# Patient Record
Sex: Male | Born: 1950 | Race: Black or African American | Hispanic: No | Marital: Single | Smoking: Never smoker
Health system: Southern US, Community
[De-identification: ages and names within clinical notes are randomized; demographics above are authoritative.]

## PROBLEM LIST (undated history)

## (undated) DIAGNOSIS — G709 Myoneural disorder, unspecified: Secondary | ICD-10-CM

## (undated) DIAGNOSIS — M1990 Unspecified osteoarthritis, unspecified site: Secondary | ICD-10-CM

## (undated) DIAGNOSIS — I10 Essential (primary) hypertension: Secondary | ICD-10-CM

## (undated) DIAGNOSIS — E119 Type 2 diabetes mellitus without complications: Secondary | ICD-10-CM

## (undated) HISTORY — PX: WISDOM TOOTH EXTRACTION: SHX21

---

## 1998-12-23 ENCOUNTER — Ambulatory Visit (HOSPITAL_COMMUNITY): Admission: RE | Admit: 1998-12-23 | Discharge: 1998-12-23 | Payer: Self-pay | Admitting: Dermatology

## 1999-01-04 ENCOUNTER — Ambulatory Visit (HOSPITAL_COMMUNITY): Admission: RE | Admit: 1999-01-04 | Discharge: 1999-01-04 | Payer: Self-pay | Admitting: Dermatology

## 2007-09-21 ENCOUNTER — Ambulatory Visit (HOSPITAL_COMMUNITY): Admission: RE | Admit: 2007-09-21 | Discharge: 2007-09-21 | Payer: Self-pay | Admitting: Neurological Surgery

## 2008-12-13 IMAGING — CR DG CHEST 2V
2 series · 2 of 2 positions shown · non-contrast
Comparison: None.

CLINICAL DATA: Pre-op chest x-ray.  Smoker.  
 CHEST - 2 VIEW:

[view not recorded (1 of 2)]
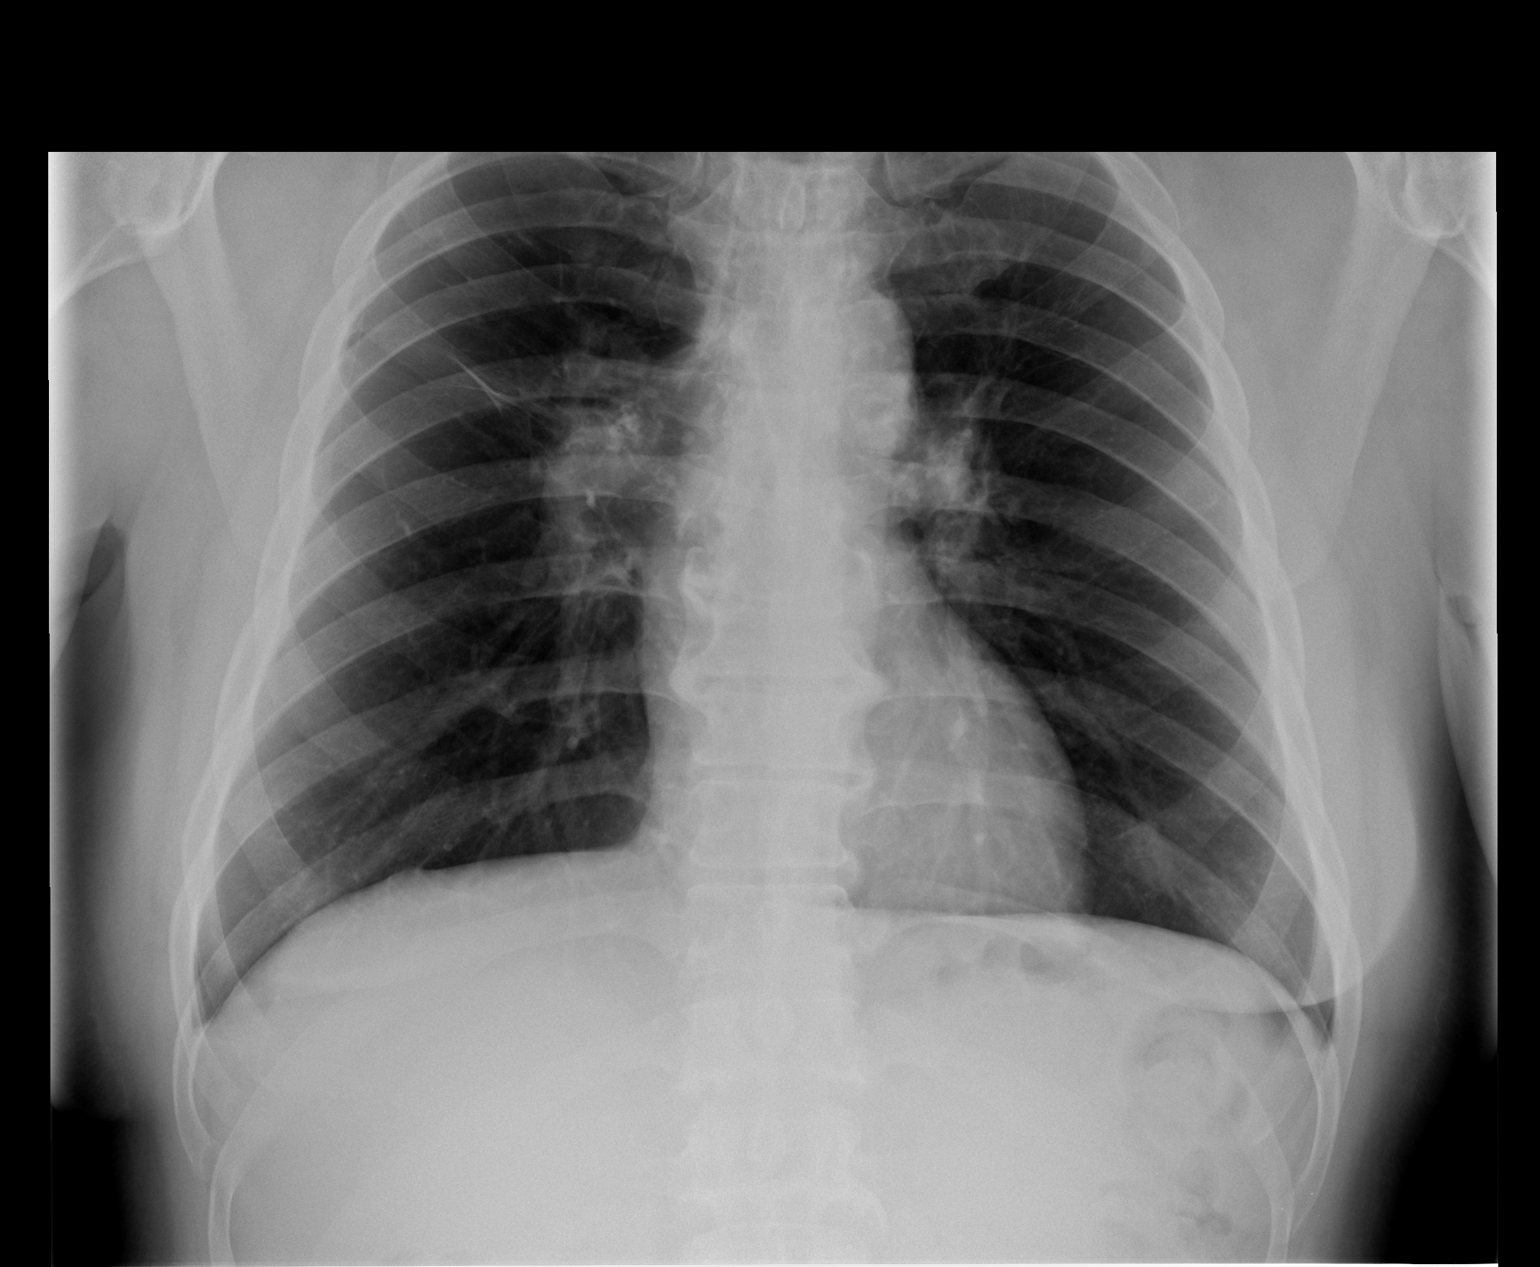

[view not recorded (2 of 2)]
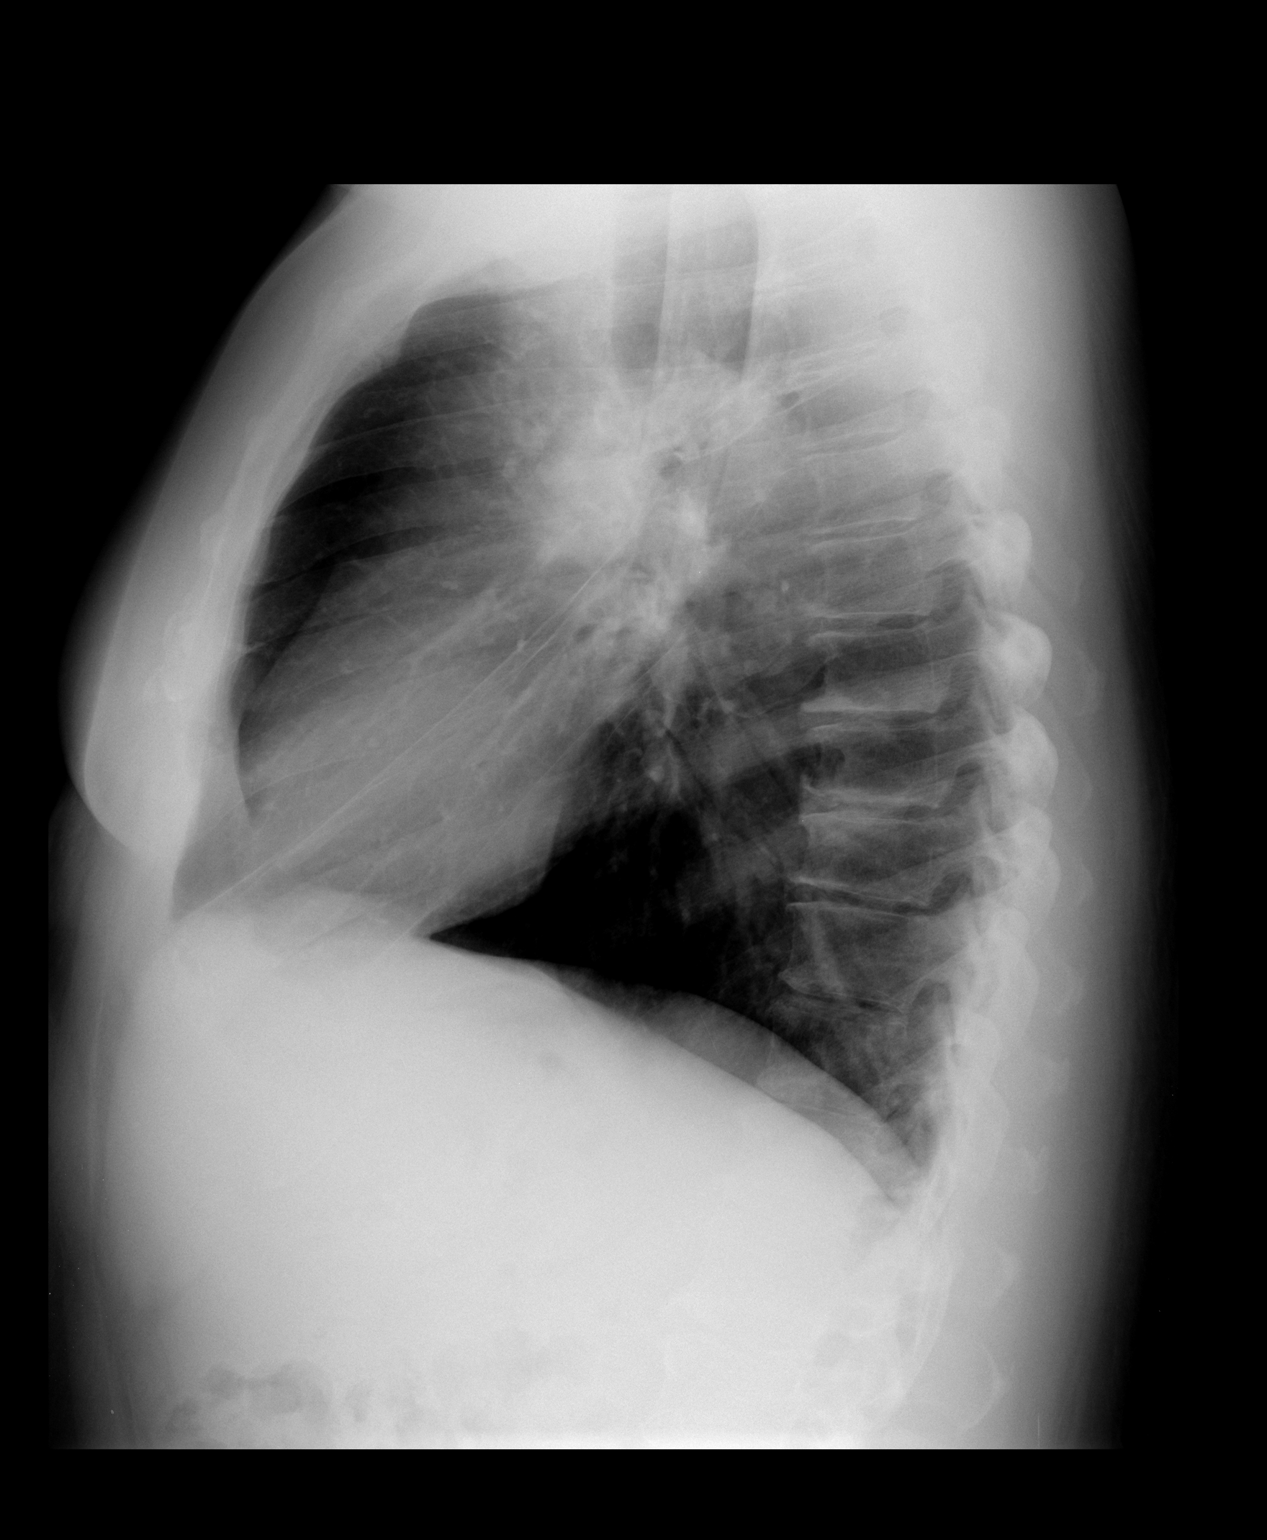

[2 of 2 positions shown; findings below may reference images not displayed]

FINDINGS: Trachea is midline.   Heart size normal.  Question mild right hilar fullness. Linear opacities in the right mid-lung zone may be associated with bullous disease.  Left lung clear.  No pleural fluid.
IMPRESSION: Question right hilar fullness.  This could be further evaluated with followup chest x-ray or chest CT in this patient with a smoking history, as clinically indicated.

## 2011-03-30 NOTE — Op Note (Signed)
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   PROCEDURE:  Left peroneal nerve decompression.   SURGEON:  Tia Alert, M.D.   ANESTHESIA:  General endotracheal anesthesia.   COMPLICATIONS:  None apparent.   INDICATIONS FOR PROCEDURE:  Mr. Tramell is a 60 year old gentleman who  presented with a 2-3 month history of a foot drop on the left side.  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  I recommended a peroneal nerve decompression with  removal of part of the fibular head in hopes of improving his foot drop.  He understood the risks, benefits, and expected outcome and wished to  proceed.   DESCRIPTION OF PROCEDURE:  The patient was taken to the operating room.  After induction of adequate generalized endotracheal anesthesia, he was  rolled into the prone position on the Wilson frame and all pressure  points were padded.  His posterior and inferior leg was prepped with  DuraPrep and draped in the usual sterile fashion.  10 mL of local  anesthesia was injected and a curvilinear incision was made from the  lateral hamstring musculature down over the lateral head of the fibula  and distally along the dorsiflexors. I was able to place self-retaining  retractors, dissect through the soft tissues to identify the peroneal  nerve just proximal to the fibula. I then decompressed this proximally  along the hamstring musculature.  I then decompressed this distally past  the fibular head  noting where the common peroneal nerve branch divided  into the superficial and deep peroneal nerves. I followed these nerves  distally and had those well decompressed, also, finding their insertions  into the peroneus longus musculature.  Once I had the nerve  decompressed, I used a periosteal elevator to remove the soft tissue  from the fibular head and then resected the lateral part of the fibular  head with Leksell rongeurs and then waxed this and then closed the  tissue over the fibular head.  The nerve was then free.  I felt quite  good about the decompression of the nerve.  I irrigated with saline  solution containing bacitracin and then closed the subcutaneous tissues  with 2-0 Vicryl, closed the subcuticular tissues with 3-0 Vicryl, and  the skin was closed with Benzoin and Steri-Strips.  The  leg was then wrapped in Kerlix and Ace bandage and the patient was  awakened from anesthesia and transferred to the recovery room in stable  condition.  At the end of the procedure, all sponge, needle and  instrument counts were correct.  Tia Alert, MD  Electronically Signed     DSJ/MEDQ  D:  09/21/2007  T:  09/22/2007  Job:  [id number]

## 2011-08-24 LAB — BASIC METABOLIC PANEL
Calcium: 9.1
Chloride: 102
Creatinine, Ser: 0.82
GFR calc Af Amer: >60
GFR calc non Af Amer: >60
Glucose, Bld: 185 — ABNORMAL HIGH

## 2011-08-24 LAB — DIFFERENTIAL
Basophils Relative: 0
Eosinophils Relative: 2
Lymphocytes Relative: 33
Neutro Abs: 2.9
Neutrophils Relative %: 54

## 2011-08-24 LAB — CBC
Hemoglobin: 13.6
Platelets: 295
RBC: 4.43
RDW: 13

## 2011-08-24 LAB — PROTIME-INR: Prothrombin Time: 13

## 2017-11-15 HISTORY — PX: COLONOSCOPY: SHX174

## 2019-11-16 DIAGNOSIS — I639 Cerebral infarction, unspecified: Secondary | ICD-10-CM

## 2019-11-16 HISTORY — DX: Cerebral infarction, unspecified: I63.9

## 2021-08-25 ENCOUNTER — Emergency Department (HOSPITAL_COMMUNITY)
Admission: EM | Admit: 2021-08-25 | Discharge: 2021-08-25 | Disposition: A | Discharge status: Home / Self Care | Payer: Medicare Other | Attending: Emergency Medicine | Admitting: Emergency Medicine

## 2021-08-25 ENCOUNTER — Other Ambulatory Visit: Payer: Self-pay

## 2021-08-25 ENCOUNTER — Emergency Department (HOSPITAL_COMMUNITY): Payer: Medicare Other

## 2021-08-25 DIAGNOSIS — R1011 Right upper quadrant pain: Secondary | ICD-10-CM | POA: Insufficient documentation

## 2021-08-25 DIAGNOSIS — R1013 Epigastric pain: Secondary | ICD-10-CM | POA: Insufficient documentation

## 2021-08-25 DIAGNOSIS — R1012 Left upper quadrant pain: Secondary | ICD-10-CM | POA: Insufficient documentation

## 2021-08-25 DIAGNOSIS — R109 Unspecified abdominal pain: Secondary | ICD-10-CM

## 2021-08-25 DIAGNOSIS — R6883 Chills (without fever): Secondary | ICD-10-CM | POA: Insufficient documentation

## 2021-08-25 DIAGNOSIS — R112 Nausea with vomiting, unspecified: Secondary | ICD-10-CM | POA: Insufficient documentation

## 2021-08-25 LAB — CBC WITH DIFFERENTIAL/PLATELET
Abs Immature Granulocytes: 0.01 10*3/uL (ref 0.00–0.07)
Basophils Absolute: 0 10*3/uL (ref 0.0–0.1)
Basophils Relative: 1 %
Eosinophils Absolute: 0 10*3/uL (ref 0.0–0.5)
Eosinophils Relative: 1 %
HCT: 36.8 % — ABNORMAL LOW (ref 39.0–52.0)
Hemoglobin: 12.2 g/dL — ABNORMAL LOW (ref 13.0–17.0)
Immature Granulocytes: 0 %
Lymphocytes Relative: 35 %
Lymphs Abs: 1.3 10*3/uL (ref 0.7–4.0)
MCH: 31 pg (ref 26.0–34.0)
MCHC: 33.2 g/dL (ref 30.0–36.0)
MCV: 93.6 fL (ref 80.0–100.0)
Monocytes Absolute: 0.4 10*3/uL (ref 0.1–1.0)
Monocytes Relative: 9 %
Neutro Abs: 2.1 10*3/uL (ref 1.7–7.7)
Neutrophils Relative %: 54 %
Platelets: 314 10*3/uL (ref 150–400)
RBC: 3.93 MIL/uL — ABNORMAL LOW (ref 4.22–5.81)
RDW: 13.1 % (ref 11.5–15.5)
WBC: 3.8 10*3/uL — ABNORMAL LOW (ref 4.0–10.5)
nRBC: 0 % (ref 0.0–0.2)

## 2021-08-25 LAB — COMPREHENSIVE METABOLIC PANEL
ALT: 20 U/L (ref 0–44)
AST: 32 U/L (ref 15–41)
Albumin: 3.9 g/dL (ref 3.5–5.0)
Alkaline Phosphatase: 77 U/L (ref 38–126)
Anion gap: 12 (ref 5–15)
BUN: 30 mg/dL — ABNORMAL HIGH (ref 8–23)
CO2: 24 mmol/L (ref 22–32)
Calcium: 9.7 mg/dL (ref 8.9–10.3)
Chloride: 98 mmol/L (ref 98–111)
Creatinine, Ser: 1.21 mg/dL (ref 0.61–1.24)
GFR, Estimated: >60 mL/min (ref >60)
Glucose, Bld: 263 mg/dL — ABNORMAL HIGH (ref 70–99)
Potassium: 4.4 mmol/L (ref 3.5–5.1)
Sodium: 134 mmol/L — ABNORMAL LOW (ref 135–145)
Total Bilirubin: 0.9 mg/dL (ref 0.3–1.2)
Total Protein: 7.6 g/dL (ref 6.5–8.1)

## 2021-08-25 LAB — LIPASE, BLOOD: Lipase: 104 U/L — ABNORMAL HIGH (ref 11–51)

## 2021-08-25 MED ORDER — ONDANSETRON 4 MG PO TBDP
4.0000 mg | ORAL_TABLET | Freq: Once | ORAL | Status: DC
  Filled 2021-08-25: qty 1

## 2021-08-25 MED ORDER — SODIUM CHLORIDE 0.9 % IV BOLUS
1000.0000 mL | Freq: Once | INTRAVENOUS | Status: AC
Start: 2021-08-25 — End: 2021-08-25
  Administered 2021-08-25: 1000 mL via INTRAVENOUS

## 2021-08-25 MED ORDER — IOHEXOL 350 MG/ML SOLN
80.0000 mL | Freq: Once | INTRAVENOUS | Status: AC | PRN
  Administered 2021-08-25: 80 mL via INTRAVENOUS

## 2021-08-25 MED ORDER — MORPHINE SULFATE (PF) 2 MG/ML IV SOLN
2.0000 mg | Freq: Once | INTRAVENOUS | Status: AC
  Administered 2021-08-25: 2 mg via INTRAVENOUS
  Filled 2021-08-25: qty 1

## 2021-08-25 MED ORDER — ONDANSETRON 4 MG PO TBDP: 4.0000 mg | ORAL_TABLET | Freq: Three times a day (TID) | ORAL | 0 refills | Status: DC | PRN

## 2021-08-25 NOTE — ED Provider Notes (Signed)
[facility] EMERGENCY DEPARTMENT Provider Note   CSN: [id number] Arrival date & time: 08/25/21  1041     History Chief Complaint  Patient presents with   Abdominal Pain    Brent Pham. is a 70 y.o. male.  With past medical history of cholelithiasis who presents emergency department with abdominal pain.  Abdominal pain is right upper quadrant, epigastric, left upper quadrant, intermittent, nonradiating dull pain with episodes of shortness.  He has associated nausea and vomiting and chills.  Pain is worse with eating.  Better with drinking milk or fasting.  States that he has taken some Tylenol intermittently with relief.    States that he was seen about a month ago for abdominal pain and was told that he needed gallbladder surgery.  States that the time he wanted to put it off to finish up some housework.  States that the pain is back.    I have personally reviewed his chart for further clarification of when he was seen a month ago for abdominal pain.  It appears that he was admitted on 07/25/2021 for acute biliary pancreatitis.  At that time he presented to the emergency department with pain similar to current presentation.  His lipase was 593.  He had a CT abdomen pelvis with gallbladder sludge.  He had an abdominal ultrasound which demonstrated cholelithiasis without cholecystitis.  He had MRCP at that time which showed mild gallbladder distention and soft tissue stranding around the pancreas with concern of pancreatitis.  He was eventually discharged home after IV fluids, antiemetics and pain regimen.  He was instructed to follow-up with GI for gallbladder removal.  I do not see any further visits where he saw the GI doctor.    Abdominal Pain Associated symptoms: chills, nausea and vomiting   Associated symptoms: no diarrhea and no fever   Abdominal Pain Associated symptoms: nausea and vomiting   Associated symptoms: no diarrhea and no fever   Abdominal  Pain Associated symptoms: nausea and vomiting   Associated symptoms: no diarrhea   Abdominal Pain Associated symptoms: nausea and vomiting   Abdominal Pain Associated symptoms: nausea     No past medical history on file.  There are no problems to display for this patient.   No family history on file.  Home Medications Prior to Admission medications   Not on File   Allergies    Patient has no allergy information on record.  Review of Systems   Review of Systems  Constitutional:  Positive for appetite change and chills. Negative for diaphoresis and fever.  Gastrointestinal:  Positive for abdominal pain, nausea and vomiting. Negative for diarrhea.  All other systems reviewed and are negative.  Physical Exam Updated Vital Signs BP (!) 141/82   Pulse 71   Temp 98.6 F (37 C) (Oral)   Resp 10   SpO2 100%   Physical Exam Vitals and nursing note reviewed.  Constitutional:      General: He is not in acute distress.    Appearance: Normal appearance. He is well-developed. He is not toxic-appearing.  HENT:     Head: Normocephalic and atraumatic.     Mouth/Throat:     Mouth: Mucous membranes are moist.     Pharynx: Oropharynx is clear.  Eyes:     General: No scleral icterus. Cardiovascular:     Rate and Rhythm: Normal rate and regular rhythm.     Heart sounds: Normal heart sounds. No murmur heard. Pulmonary:  Effort: Pulmonary effort is normal. No respiratory distress.     Breath sounds: Normal breath sounds.  Abdominal:     General: Abdomen is flat. Bowel sounds are normal. There is no distension.     Palpations: Abdomen is soft. There is no hepatomegaly.     Tenderness: There is abdominal tenderness in the right upper quadrant, epigastric area and left upper quadrant. There is no guarding or rebound. Positive signs include Murphy's sign. Negative signs include Rovsing's sign and McBurney's sign.  Skin:    General: Skin is warm and dry.     Capillary Refill:  Capillary refill takes less than 2 seconds.     Coloration: Skin is not jaundiced.  Neurological:     General: No focal deficit present.     Mental Status: He is alert and oriented to person, place, and time.  Psychiatric:        Mood and Affect: Mood normal.        Behavior: Behavior normal.    ED Results / Procedures / Treatments   Labs (all labs ordered are listed, but only abnormal results are displayed) Labs Reviewed  COMPREHENSIVE METABOLIC PANEL - Abnormal; Notable for the following components:      Result Value   Sodium 134 (*)    Glucose, Bld 263 (*)    BUN 30 (*)    All other components within normal limits  LIPASE, BLOOD - Abnormal; Notable for the following components:   Lipase 104 (*)    All other components within normal limits  CBC WITH DIFFERENTIAL/PLATELET - Abnormal; Notable for the following components:   WBC 3.8 (*)    RBC 3.93 (*)    Hemoglobin 12.2 (*)    HCT 36.8 (*)    All other components within normal limits    EKG None  Radiology CT Abdomen Pelvis W Contrast  Result Date: 08/25/2021 CLINICAL DATA:  Abdominal pain and vomiting. EXAM: CT ABDOMEN AND PELVIS WITH CONTRAST TECHNIQUE: Multidetector CT imaging of the abdomen and pelvis was performed using the standard protocol following bolus administration of intravenous contrast. CONTRAST:  46mL OMNIPAQUE IOHEXOL 350 MG/ML SOLN COMPARISON:  July 25, 2021 FINDINGS: Lower chest: No acute abnormality. Hepatobiliary: There is diffuse fatty infiltration of the liver parenchyma. No focal liver abnormality is seen. Numerous stable subcentimeter calcifications are seen along the porta hepatis, adjacent to the distal esophagus and anterior to the gastroesophageal junction. The gallbladder is markedly distended. No gallstones, gallbladder wall thickening, or biliary dilatation. Pancreas: Unremarkable. No pancreatic ductal dilatation or surrounding inflammatory changes. Spleen: Normal in size without focal  abnormality. Adrenals/Urinary Tract: Adrenal glands are unremarkable. Kidneys are normal, without renal calculi, focal lesion, or hydronephrosis. Mild, stable posterior urinary bladder wall thickening is seen. This is seen as far back as May 16, 2017. The anterior urinary bladder wall thickening seen on the prior study is no longer visualized. Stomach/Bowel: Stomach is within normal limits. The appendix is not clearly identified. No evidence of bowel dilatation. Marked severity asymmetric colonic wall thickening is seen within the region of the mid ascending colon (axial CT images 42 through 49, CT series 3/coronal reformatted images 39 through 51, CT series 6). This is not clearly identified on the prior study. Vascular/Lymphatic: Aortic atherosclerosis. No enlarged abdominal or pelvic lymph nodes. Reproductive: Prostate is unremarkable. Other: A stable 3.8 cm x 2.0 cm fat containing left inguinal hernia is seen. No abdominopelvic ascites. Musculoskeletal: Marked severity degenerative changes are seen at the level of L4-L5. IMPRESSION:  1. Colonic wall thickening within the region of the mid ascending colon, which is not seen on the prior study and may represent sequelae associated with moderate to marked severity colitis. 2. Fatty liver. 3. Markedly distended gallbladder without evidence of cholelithiasis or acute cholecystitis. Aortic Atherosclerosis (ICD10-I70.0). Electronically Signed   By: Aram Candela M.D.   On: 08/25/2021 19:00   US Abdomen Limited RUQ (LIVER/GB)  Result Date: 08/25/2021 CLINICAL DATA:  Right upper quadrant pain EXAM: ULTRASOUND ABDOMEN LIMITED RIGHT UPPER QUADRANT COMPARISON:  CT and ultrasound 07/25/2021 FINDINGS: Gallbladder: 5 mm nonshadowing focus along the gallbladder wall compatible with small polyp or nonshadowing stone. No visible stones or wall thickening. Small amount of layering sludge. Common bile duct: Diameter: Normal caliber, 2 mm Liver: No focal lesion identified.  Within normal limits in parenchymal echogenicity. Portal vein is patent on color Doppler imaging with normal direction of blood flow towards the liver. Other: None. IMPRESSION: No acute findings. Small gallbladder wall polyp or nonshadowing stone. No evidence of acute cholecystitis. Electronically Signed   By: Charlett Nose M.D.   On: 08/25/2021 12:09    Procedures Procedures   Medications Ordered in ED Medications  ondansetron (ZOFRAN-ODT) disintegrating tablet 4 mg (has no administration in time range)  morphine 2 MG/ML injection 2 mg (2 mg Intravenous Given 08/25/21 1655)  sodium chloride 0.9 % bolus 1,000 mL (1,000 mLs Intravenous New Bag/Given 08/25/21 1655)   ED Course  I have reviewed the triage vital signs and the nursing notes.  Pertinent labs & imaging results that were available during my care of the patient were reviewed by me and considered in my medical decision making (see chart for details).  MDM Rules/Calculators/A&P 70 year old male who presents emergency department for right upper quadrant abdominal pain.   Presentation is most consistent with cholelithiasis.  However will attempt to rule out other etiologies of abdominal pain.  His presentation is not consistent with diverticulitis, appendicitis, dissection.  He has no complaints of cough, sputum production, fever so doubt pneumonia. Labs unremarkable Obtain right upper quadrant ultrasound which showed a nonshadowing stone without evidence of acute cholecystitis. Obtain CT abdomen pelvis with contrast which showed colonic wall thickening of the mid ascending colon, markedly distended gallbladder without evidence of cholelithiasis or cholecystitis. He does not have jaundice, leukocytosis, fever, altered mental status, gallstones concerning for cholangitis. Given 1 L IV fluids, Zofran and morphine.  P.o. challenged here in the emergency department.  Able to tolerate food and water. Given that he does not have increased LFTs,  leukocytosis or fever, we will discharge him with close follow-up with [facility] for removal of his gallbladder as this will be the definitive treatment of his pain.  Final Clinical Impression(s) / ED Diagnoses Final diagnoses:  Abdominal pain    Rx / DC Orders ED Discharge Orders     None        Cristopher Peru, PA-C 08/25/21 1915    Maia Plan, MD 08/25/21 2214

## 2021-08-25 NOTE — Discharge Instructions (Addendum)
You were seen in the emergency department today for abdominal pain.  This appears consistent to the last time you were seen for abdominal pain and admitted for pancreatitis.  However this time your lipase is not elevated and we do not have concern that you have pancreatitis at this time.  You do have perhaps a small gallstone and distention of your gallbladder without infection.  Your labs were all reassuring.  It is imperative that you follow-up with a general surgeon in order to remove your gallbladder which will ultimately relieve the pain that you are having.  I have put in your discharge instructions the name and number of [facility].  Please call them in the morning to set up an appointment and schedule the surgery.  You may return to emergency department for any reason.  Please return for increasing abdominal pain, nausea and vomiting that does not go away and is persistent, fever.

## 2021-08-25 NOTE — ED Notes (Signed)
Pt teaching provided on medications that may cause drowsiness. Pt instructed not to drive or operate heavy machinery while taking the prescribed medication. Pt verbalized understanding.   Pt states he is calling his family to pick him up.   Pt provided discharge instructions and prescription information. Pt was given the opportunity to ask questions and questions were answered. Discharge signature not obtained in the setting of the COVID-19 pandemic in order to reduce high touch surfaces.

## 2021-08-25 NOTE — ED Provider Notes (Signed)
Emergency Medicine Provider Triage Evaluation Note  Brent Pham , a 70 y.o. male  was evaluated in triage.  Pt complains of abdominal pain, nausea and vomiting.  States that he was seen about a month ago for abdominal pain and was told that he needed gallbladder surgery.  States that the time he wanted to put it off to finish up some housework.  States that the pain is back.  He describes it as right upper quadrant, epigastric, left upper quadrant abdominal tenderness.  He has had some increased nausea and vomiting when he eats.  Also endorses chills.  States that at times milk makes it better.  He has been using Tylenol intermittently with some relief.  Review of Systems  Positive: Abdominal pain, nausea, vomiting Negative: Fever, skin or eye yellowing, diarrhea    Physical Exam  BP (!) 150/84 (BP Location: Right Arm)   Pulse 94   Temp 98.6 F (37 C) (Oral)   Resp 16   SpO2 100%  Gen:   Awake, no distress   Resp:  Normal effort  MSK:   Moves extremities without difficulty  Other:  Abdomen is soft. + Bowel sounds.  Tenderness to light palpation of the right upper quadrant, epigastric, left upper quadrant.  Positive Murphy sign.  Negative rebound.  Medical Decision Making  Medically screening exam initiated at 11:08 AM.  Appropriate orders placed.  Dennie Maizes Laureano was informed that the remainder of the evaluation will be completed by another provider, this initial triage assessment does not replace that evaluation, and the importance of remaining in the ED until their evaluation is complete.     Cristopher Peru, PA-C 08/25/21 1110    Terald Sleeper, MD 08/25/21 [phone]

## 2021-08-25 NOTE — ED Notes (Signed)
Pt tolerated water and crackers with no episodes of nausea.

## 2021-08-25 NOTE — ED Triage Notes (Signed)
Pt c/o abdominal pain and vomiting x 1. Pt also reports chills. Per pt was seen at another facility about a month ago for pain, was told pt would need gall bladder surgery.

## 2021-08-31 NOTE — Progress Notes (Signed)
Sent message, via epic in basket, requesting orders in epic from surgeon.  

## 2021-09-03 ENCOUNTER — Ambulatory Visit: Payer: Self-pay | Admitting: General Surgery

## 2021-09-03 DIAGNOSIS — Z01818 Encounter for other preprocedural examination: Secondary | ICD-10-CM

## 2021-09-03 NOTE — Patient Instructions (Addendum)
DUE TO COVID-19 ONLY ONE VISITOR IS ALLOWED TO COME WITH YOU AND STAY IN THE WAITING ROOM ONLY DURING PRE OP AND PROCEDURE DAY OF SURGERY IF YOU ARE GOING HOME AFTER SURGERY. IF YOU ARE SPENDING THE NIGHT 2 PEOPLE MAY VISIT WITH YOU IN YOUR PRIVATE ROOM AFTER SURGERY UNTIL VISITING  HOURS ARE OVER AT 800 PM AND THE 2 VISITORS CANNOT SPEND THE NIGHT.  Brent Pham               Brent Pham.     Your procedure is scheduled on: 09/17/21   Report to [facility] Main  Entrance   Report to admitting at  9:15 AM      Call this number if you have problems the morning of surgery [phone]    Remember: Do not eat food  :After Midnight the night before your surgery,     You may have clear liquids from midnight until ---.8:30  Finish the G2 by then(8:30)  CLEAR LIQUID DIET   Foods Allowed                                                                     Foods Excluded  Coffee and tea, regular and decaf     No milk or creamer                                                                 liquids that you cannot  Plain Jell-O any favor except red or purple                                           see through such as: Fruit ices (not with fruit pulp)                                     milk, soups, orange juice  Iced Popsicles                                    All solid food Carbonated beverages, regular and diet                                    Cranberry, grape and apple juices Sports drinks like Gatorade Lightly seasoned clear broth or consume(fat free) Sugar    BRUSH YOUR TEETH MORNING OF SURGERY AND RINSE YOUR MOUTH OUT, NO CHEWING GUM CANDY OR MINTS.        BRUSH YOUR TEETH MORNING OF SURGERY AND RINSE YOUR MOUTH OUT, NO CHEWING GUM CANDY OR MINTS.     Take these medicines the morning of surgery with A SIP OF WATER: none  DO NOT TAKE ANY DIABETIC MEDICATIONS DAY OF  YOUR SURGERY How to Manage Your Diabetes Before and After Surgery  Why is it important to  control my blood sugar before and after surgery? Improving blood sugar levels before and after surgery helps healing and can limit problems. A way of improving blood sugar control is eating a healthy diet by:  Eating less sugar and carbohydrates  Increasing activity/exercise  Talking with your doctor about reaching your blood sugar goals High blood sugars (greater than 180 mg/dL) can raise your risk of infections and slow your recovery, so you will need to focus on controlling your diabetes during the weeks before surgery. Make sure that the doctor who takes care of your diabetes knows about your planned surgery including the date and location.  How do I manage my blood sugar before surgery? Check your blood sugar at least 4 times a day, starting 2 days before surgery, to make sure that the level is not too high or low. Check your blood sugar the morning of your surgery when you wake up and every 2 hours until you get to the Short Stay unit. If your blood sugar is less than 70 mg/dL, you will need to treat for low blood sugar: Do not take insulin. Treat a low blood sugar (less than 70 mg/dL) with  cup of clear juice (cranberry or apple), 4 glucose tablets, OR glucose gel. Recheck blood sugar in 15 minutes after treatment (to make sure it is greater than 70 mg/dL). If your blood sugar is not greater than 70 mg/dL on recheck, call [phone] for further instructions. Report your blood sugar to the short stay nurse when you get to Short Stay.  If you are admitted to the hospital after surgery: Your blood sugar will be checked by the staff and you will probably be given insulin after surgery (instead of oral diabetes medicines) to make sure you have good blood sugar levels. The goal for blood sugar control after surgery is 80-180 mg/dL.   WHAT DO I DO ABOUT MY DIABETES MEDICATION?  The day before surgery take only the morning dose of Glimepride.  Do not take your Janumet the day before  surgery  Do not take oral diabetes medicines (pills) the morning of surgery. .                                 You may not have any metal on your body including              piercings  Do not wear jewelry,  lotions, powders or deodorant             Men may shave face and neck.   Do not bring valuables to the hospital. [facility] IS NOT             RESPONSIBLE   FOR VALUABLES.  Contacts, dentures or bridgework may not be worn into surgery.     Patients discharged the day of surgery will not be allowed to drive home.  IF YOU ARE HAVING SURGERY AND GOING HOME THE SAME DAY, YOU MUST HAVE AN ADULT TO DRIVE YOU HOME AND BE WITH YOU FOR 24 HOURS. YOU MAY GO HOME BY TAXI OR UBER OR ORTHERWISE, BUT AN ADULT MUST ACCOMPANY YOU HOME AND STAY WITH YOU FOR 24 HOURS.  Name and phone number of your driver:  Special Instructions: N/A  Please read over the following fact sheets you were given: _____________________________________________________________________             [facility] - Preparing for Surgery Before surgery, you can play an important role.  Because skin is not sterile, your skin needs to be as free of germs as possible.  You can reduce the number of germs on your skin by washing with CHG (chlorahexidine gluconate) soap before surgery.  CHG is an antiseptic cleaner which kills germs and bonds with the skin to continue killing germs even after washing. Please DO NOT use if you have an allergy to CHG or antibacterial soaps.  If your skin becomes reddened/irritated stop using the CHG and inform your nurse when you arrive at Short Stay. You may shave your face/neck. Please follow these instructions carefully:  1.  Shower with CHG Soap the night before surgery and the  morning of Surgery.  2.  If you choose to wash your hair, wash your hair first as usual with your  normal  shampoo.  3.  After you shampoo, rinse your hair and body thoroughly to remove the  shampoo.                             4.  Use CHG as you would any other liquid soap.  You can apply chg directly  to the skin and wash                       Gently with a scrungie or clean washcloth.  5.  Apply the CHG Soap to your body ONLY FROM THE NECK DOWN.   Do not use on face/ open                           Wound or open sores. Avoid contact with eyes, ears mouth and genitals (private parts).                       Wash face,  Genitals (private parts) with your normal soap.             6.  Wash thoroughly, paying special attention to the area where your surgery  will be performed.  7.  Thoroughly rinse your body with warm water from the neck down.  8.  DO NOT shower/wash with your normal soap after using and rinsing off  the CHG Soap.                9.  Pat yourself dry with a clean towel.            10.  Wear clean pajamas.            11.  Place clean sheets on your bed the night of your first shower and do not  sleep with pets. Day of Surgery : Do not apply any lotions/deodorants the morning of surgery.  Please wear clean clothes to the hospital/surgery center.  FAILURE TO FOLLOW THESE INSTRUCTIONS MAY RESULT IN THE CANCELLATION OF YOUR SURGERY PATIENT SIGNATURE_________________________________  NURSE SIGNATURE__________________________________  ________________________________________________________________________

## 2021-09-07 ENCOUNTER — Encounter (HOSPITAL_COMMUNITY): Payer: Self-pay

## 2021-09-07 ENCOUNTER — Other Ambulatory Visit: Payer: Self-pay

## 2021-09-07 ENCOUNTER — Encounter (HOSPITAL_COMMUNITY)
Admission: RE | Admit: 2021-09-07 | Discharge: 2021-09-07 | Disposition: A | Discharge status: Home / Self Care | Payer: Medicare Other | Source: Ambulatory Visit | Attending: General Surgery | Admitting: General Surgery

## 2021-09-07 VITALS — BP 151/78 | HR 75 | Temp 98.8°F | Resp 18 | Ht 69.0 in | Wt 161.0 lb

## 2021-09-07 DIAGNOSIS — Z8673 Personal history of transient ischemic attack (TIA), and cerebral infarction without residual deficits: Secondary | ICD-10-CM | POA: Insufficient documentation

## 2021-09-07 DIAGNOSIS — K8020 Calculus of gallbladder without cholecystitis without obstruction: Secondary | ICD-10-CM | POA: Diagnosis not present

## 2021-09-07 DIAGNOSIS — Z79899 Other long term (current) drug therapy: Secondary | ICD-10-CM | POA: Insufficient documentation

## 2021-09-07 DIAGNOSIS — I10 Essential (primary) hypertension: Secondary | ICD-10-CM | POA: Insufficient documentation

## 2021-09-07 DIAGNOSIS — Z01812 Encounter for preprocedural laboratory examination: Secondary | ICD-10-CM | POA: Insufficient documentation

## 2021-09-07 DIAGNOSIS — Z7984 Long term (current) use of oral hypoglycemic drugs: Secondary | ICD-10-CM | POA: Insufficient documentation

## 2021-09-07 DIAGNOSIS — E119 Type 2 diabetes mellitus without complications: Secondary | ICD-10-CM | POA: Diagnosis not present

## 2021-09-07 DIAGNOSIS — Z01818 Encounter for other preprocedural examination: Secondary | ICD-10-CM

## 2021-09-07 HISTORY — DX: Essential (primary) hypertension: I10

## 2021-09-07 HISTORY — DX: Unspecified osteoarthritis, unspecified site: M19.90

## 2021-09-07 HISTORY — DX: Type 2 diabetes mellitus without complications: E11.9

## 2021-09-07 HISTORY — DX: Myoneural disorder, unspecified: G70.9

## 2021-09-07 LAB — CBC WITH DIFFERENTIAL/PLATELET
Abs Immature Granulocytes: 0.02 10*3/uL (ref 0.00–0.07)
Basophils Absolute: 0 10*3/uL (ref 0.0–0.1)
Basophils Relative: 1 %
Eosinophils Absolute: 0.1 10*3/uL (ref 0.0–0.5)
Eosinophils Relative: 1 %
HCT: 34.8 % — ABNORMAL LOW (ref 39.0–52.0)
Hemoglobin: 11.4 g/dL — ABNORMAL LOW (ref 13.0–17.0)
Immature Granulocytes: 1 %
Lymphocytes Relative: 28 %
Lymphs Abs: 1.1 10*3/uL (ref 0.7–4.0)
MCH: 31.6 pg (ref 26.0–34.0)
MCHC: 32.8 g/dL (ref 30.0–36.0)
MCV: 96.4 fL (ref 80.0–100.0)
Monocytes Absolute: 0.4 10*3/uL (ref 0.1–1.0)
Monocytes Relative: 9 %
Neutro Abs: 2.6 10*3/uL (ref 1.7–7.7)
Neutrophils Relative %: 60 %
Platelets: 303 10*3/uL (ref 150–400)
RBC: 3.61 MIL/uL — ABNORMAL LOW (ref 4.22–5.81)
RDW: 13.2 % (ref 11.5–15.5)
WBC: 4.2 10*3/uL (ref 4.0–10.5)
nRBC: 0 % (ref 0.0–0.2)

## 2021-09-07 LAB — COMPREHENSIVE METABOLIC PANEL
ALT: 16 U/L (ref 0–44)
AST: 22 U/L (ref 15–41)
Albumin: 3.9 g/dL (ref 3.5–5.0)
Alkaline Phosphatase: 68 U/L (ref 38–126)
Anion gap: 8 (ref 5–15)
BUN: 16 mg/dL (ref 8–23)
CO2: 25 mmol/L (ref 22–32)
Calcium: 9.3 mg/dL (ref 8.9–10.3)
Chloride: 103 mmol/L (ref 98–111)
Creatinine, Ser: 0.85 mg/dL (ref 0.61–1.24)
GFR, Estimated: >60 mL/min (ref >60)
Glucose, Bld: 292 mg/dL — ABNORMAL HIGH (ref 70–99)
Potassium: 4.3 mmol/L (ref 3.5–5.1)
Sodium: 136 mmol/L (ref 135–145)
Total Bilirubin: 0.6 mg/dL (ref 0.3–1.2)
Total Protein: 7.1 g/dL (ref 6.5–8.1)

## 2021-09-07 LAB — HEMOGLOBIN A1C
Hgb A1c MFr Bld: 9.3 % — ABNORMAL HIGH (ref 4.8–5.6)
Mean Plasma Glucose: 220.21 mg/dL

## 2021-09-07 LAB — GLUCOSE, CAPILLARY: Glucose-Capillary: 256 mg/dL — ABNORMAL HIGH (ref 70–99)

## 2021-09-07 NOTE — Progress Notes (Signed)
COVID test- NA   PCP - Dr. Brandon Melnick Cardiologist - none  Chest x-ray - no EKG - 07/25/21 -care everywhere- requested Stress Test - no ECHO - no Cardiac Cath - no Pacemaker/ICD device last checked:NA  Sleep Study - no CPAP -   Fasting Blood Sugar - 130-150 Checks Blood Sugar __QD___ times a day. Pt's CBG was 256 at PAT visit. I told him to monitor his CBGs and call his Dr if he is trending high.  Blood Thinner Instructions:NA Aspirin Instructions: Last Dose:  Anesthesia review: yes  Patient denies shortness of breath, fever, cough and chest pain at PAT appointment Pt has no SOB with any activities.   Patient verbalized understanding of instructions that were given to them at the PAT appointment. Patient was also instructed that they will need to review over the PAT instructions again at home before surgery. yes

## 2021-09-09 ENCOUNTER — Encounter (HOSPITAL_COMMUNITY): Payer: Self-pay

## 2021-09-09 NOTE — Progress Notes (Signed)
Anesthesia Chart Review   Case: [id number] Date/Time: 09/17/21 1115   Procedure: LAPAROSCOPIC CHOLECYSTECTOMY WITH INTRAOPERATIVE CHOLANGIOGRAM   Anesthesia type: General   Pre-op diagnosis: SYMPTOMATIC CHOLELITHIASIS, BILIARY PANCREATITIS   Location: [facility] ROOM 01 / [facility]   Surgeons: [name], MD       DISCUSSION:70 y.o. never smoker with h/o HTN, DM II, stroke, symptomatic cholelithiasis scheduled for above procedure 09/17/2021 with Dr. [name].   CBG 256 at PAT visit, non-fasting.  A1C 9.3 which is elevated from 7.7 on 05/11/2021.  Forwarded to Dr. Andrey Campanile.  VS: BP (!) 151/78   Pulse 75   Temp 37.1 C (Oral)   Resp 18   Ht 5\' 9"  (1.753 m)   Wt 73 kg   SpO2 100%   BMI 23.78 kg/m   PROVIDERS: [place], , PA-C is PCP    LABS: Labs reviewed: Acceptable for surgery. (all labs ordered are listed, but only abnormal results are displayed)  Labs Reviewed  COMPREHENSIVE METABOLIC PANEL - Abnormal; Notable for the following components:      Result Value   Glucose, Bld 292 (*)    All other components within normal limits  CBC WITH DIFFERENTIAL/PLATELET - Abnormal; Notable for the following components:   RBC 3.61 (*)    Hemoglobin 11.4 (*)    HCT 34.8 (*)    All other components within normal limits  HEMOGLOBIN A1C - Abnormal; Notable for the following components:   Hgb A1c MFr Bld 9.3 (*)    All other components within normal limits  GLUCOSE, CAPILLARY - Abnormal; Notable for the following components:   Glucose-Capillary 256 (*)    All other components within normal limits     IMAGES:   EKG:   CV: Echo 01/22/2020 SUMMARY  Left ventricular systolic function is normal.  LV ejection fraction = 60-65%.  There is no significant valvular stenosis or regurgitation.  There is no comparison study available.  Past Medical History:  Diagnosis Date   Arthritis    hands   Diabetes mellitus without complication (HCC)    Hypertension    Neuromuscular disorder  (HCC)    bi lat feet   Stroke (HCC) 2021    Past Surgical History:  Procedure Laterality Date   COLONOSCOPY  2019   WISDOM TOOTH EXTRACTION     age 77    MEDICATIONS:  glimepiride (AMARYL) 2 MG tablet   JANUMET 50-1000 MG tablet   lisinopril (ZESTRIL) 10 MG tablet   Multiple Vitamins-Minerals (CENTRUM SILVER ULTRA MENS PO)   ondansetron (ZOFRAN ODT) 4 MG disintegrating tablet   No current facility-administered medications for this encounter.    [address], PA-C [facility] Pre-Surgical Testing [phone]

## 2021-09-17 ENCOUNTER — Encounter (HOSPITAL_COMMUNITY): Payer: Self-pay | Admitting: General Surgery

## 2021-09-17 ENCOUNTER — Encounter (HOSPITAL_COMMUNITY)
Admission: RE | Disposition: A | Discharge status: Home / Self Care | Payer: Self-pay | Source: Home / Self Care | Attending: General Surgery

## 2021-09-17 ENCOUNTER — Ambulatory Visit (HOSPITAL_COMMUNITY): Payer: Medicare Other | Admitting: Certified Registered Nurse Anesthetist

## 2021-09-17 ENCOUNTER — Ambulatory Visit (HOSPITAL_COMMUNITY): Payer: Medicare Other

## 2021-09-17 ENCOUNTER — Ambulatory Visit (HOSPITAL_COMMUNITY)
Admission: RE | Admit: 2021-09-17 | Discharge: 2021-09-17 | Disposition: A | Discharge status: Home / Self Care | Payer: Medicare Other | Attending: General Surgery | Admitting: General Surgery

## 2021-09-17 ENCOUNTER — Ambulatory Visit (HOSPITAL_COMMUNITY): Payer: Medicare Other | Admitting: Physician Assistant

## 2021-09-17 DIAGNOSIS — Z7982 Long term (current) use of aspirin: Secondary | ICD-10-CM | POA: Diagnosis not present

## 2021-09-17 DIAGNOSIS — E1169 Type 2 diabetes mellitus with other specified complication: Secondary | ICD-10-CM

## 2021-09-17 DIAGNOSIS — K811 Chronic cholecystitis: Secondary | ICD-10-CM | POA: Insufficient documentation

## 2021-09-17 DIAGNOSIS — Z7984 Long term (current) use of oral hypoglycemic drugs: Secondary | ICD-10-CM | POA: Insufficient documentation

## 2021-09-17 DIAGNOSIS — Z419 Encounter for procedure for purposes other than remedying health state, unspecified: Secondary | ICD-10-CM

## 2021-09-17 DIAGNOSIS — E1142 Type 2 diabetes mellitus with diabetic polyneuropathy: Secondary | ICD-10-CM | POA: Insufficient documentation

## 2021-09-17 DIAGNOSIS — Z8673 Personal history of transient ischemic attack (TIA), and cerebral infarction without residual deficits: Secondary | ICD-10-CM | POA: Diagnosis not present

## 2021-09-17 DIAGNOSIS — Z79899 Other long term (current) drug therapy: Secondary | ICD-10-CM | POA: Diagnosis not present

## 2021-09-17 DIAGNOSIS — K851 Biliary acute pancreatitis without necrosis or infection: Secondary | ICD-10-CM | POA: Diagnosis not present

## 2021-09-17 DIAGNOSIS — I10 Essential (primary) hypertension: Secondary | ICD-10-CM | POA: Diagnosis not present

## 2021-09-17 HISTORY — PX: CHOLECYSTECTOMY: SHX55

## 2021-09-17 LAB — GLUCOSE, CAPILLARY
Glucose-Capillary: 149 mg/dL — ABNORMAL HIGH (ref 70–99)
Glucose-Capillary: 175 mg/dL — ABNORMAL HIGH (ref 70–99)

## 2021-09-17 SURGERY — LAPAROSCOPIC CHOLECYSTECTOMY WITH INTRAOPERATIVE CHOLANGIOGRAM
Anesthesia: General | Site: Abdomen

## 2021-09-17 MED ORDER — ONDANSETRON 4 MG PO TBDP: 4.0000 mg | ORAL_TABLET | Freq: Three times a day (TID) | ORAL | 0 refills | Status: AC | PRN

## 2021-09-17 MED ORDER — FENTANYL CITRATE (PF) 100 MCG/2ML IJ SOLN
INTRAMUSCULAR | Status: AC
  Filled 2021-09-17: qty 2

## 2021-09-17 MED ORDER — PHENYLEPHRINE HCL-NACL 20-0.9 MG/250ML-% IV SOLN
INTRAVENOUS | Status: DC | PRN
  Administered 2021-09-17: 30 ug/min via INTRAVENOUS

## 2021-09-17 MED ORDER — MIDAZOLAM HCL 2 MG/2ML IJ SOLN
INTRAMUSCULAR | Status: AC
  Filled 2021-09-17: qty 2

## 2021-09-17 MED ORDER — ACETAMINOPHEN 500 MG PO TABS: 1000.0000 mg | ORAL_TABLET | Freq: Three times a day (TID) | ORAL | 0 refills | Status: AC

## 2021-09-17 MED ORDER — ORAL CARE MOUTH RINSE: 15.0000 mL | Freq: Once | OROMUCOSAL | Status: AC

## 2021-09-17 MED ORDER — LACTATED RINGERS IV SOLN: INTRAVENOUS | Status: DC

## 2021-09-17 MED ORDER — ROCURONIUM BROMIDE 10 MG/ML (PF) SYRINGE
PREFILLED_SYRINGE | INTRAVENOUS | Status: DC | PRN
  Administered 2021-09-17: 50 mg via INTRAVENOUS
  Administered 2021-09-17: 20 mg via INTRAVENOUS

## 2021-09-17 MED ORDER — PROPOFOL 10 MG/ML IV BOLUS
INTRAVENOUS | Status: DC | PRN
  Administered 2021-09-17: 60 mg via INTRAVENOUS
  Administered 2021-09-17: 110 mg via INTRAVENOUS

## 2021-09-17 MED ORDER — ONDANSETRON HCL 4 MG/2ML IJ SOLN
INTRAMUSCULAR | Status: DC | PRN
  Administered 2021-09-17: 4 mg via INTRAVENOUS

## 2021-09-17 MED ORDER — MIDAZOLAM HCL 5 MG/5ML IJ SOLN
INTRAMUSCULAR | Status: DC | PRN
  Administered 2021-09-17: 2 mg via INTRAVENOUS

## 2021-09-17 MED ORDER — CHLORHEXIDINE GLUCONATE CLOTH 2 % EX PADS: 6.0000 | MEDICATED_PAD | Freq: Once | CUTANEOUS | Status: DC

## 2021-09-17 MED ORDER — KETOROLAC TROMETHAMINE 15 MG/ML IJ SOLN
INTRAMUSCULAR | Status: DC | PRN
  Administered 2021-09-17: 15 mg via INTRAVENOUS

## 2021-09-17 MED ORDER — BUPIVACAINE-EPINEPHRINE (PF) 0.25% -1:200000 IJ SOLN
INTRAMUSCULAR | Status: AC
  Filled 2021-09-17: qty 30

## 2021-09-17 MED ORDER — BUPIVACAINE-EPINEPHRINE 0.25% -1:200000 IJ SOLN
INTRAMUSCULAR | Status: DC | PRN
  Administered 2021-09-17: 10 mL

## 2021-09-17 MED ORDER — PROPOFOL 10 MG/ML IV BOLUS
INTRAVENOUS | Status: AC
  Filled 2021-09-17: qty 20

## 2021-09-17 MED ORDER — HYDROMORPHONE HCL 1 MG/ML IJ SOLN: 0.2500 mg | INTRAMUSCULAR | Status: DC | PRN

## 2021-09-17 MED ORDER — ROCURONIUM BROMIDE 10 MG/ML (PF) SYRINGE
PREFILLED_SYRINGE | INTRAVENOUS | Status: AC
  Filled 2021-09-17: qty 10

## 2021-09-17 MED ORDER — OXYCODONE HCL 5 MG PO TABS: 5.0000 mg | ORAL_TABLET | Freq: Once | ORAL | Status: AC

## 2021-09-17 MED ORDER — ESMOLOL HCL 100 MG/10ML IV SOLN
INTRAVENOUS | Status: DC | PRN
  Administered 2021-09-17 (×2): 15 mg via INTRAVENOUS

## 2021-09-17 MED ORDER — OXYCODONE HCL 5 MG PO TABS: 5.0000 mg | ORAL_TABLET | Freq: Four times a day (QID) | ORAL | 0 refills | Status: AC | PRN

## 2021-09-17 MED ORDER — MEPERIDINE HCL 50 MG/ML IJ SOLN: 6.2500 mg | INTRAMUSCULAR | Status: DC | PRN

## 2021-09-17 MED ORDER — METOPROLOL TARTRATE 5 MG/5ML IV SOLN
INTRAVENOUS | Status: AC
  Filled 2021-09-17: qty 5

## 2021-09-17 MED ORDER — OXYCODONE HCL 5 MG PO TABS
ORAL_TABLET | ORAL | Status: AC
  Administered 2021-09-17: 5 mg via ORAL
  Filled 2021-09-17: qty 1

## 2021-09-17 MED ORDER — DROPERIDOL 2.5 MG/ML IJ SOLN: 0.6250 mg | Freq: Once | INTRAMUSCULAR | Status: DC | PRN

## 2021-09-17 MED ORDER — PHENYLEPHRINE 40 MCG/ML (10ML) SYRINGE FOR IV PUSH (FOR BLOOD PRESSURE SUPPORT)
PREFILLED_SYRINGE | INTRAVENOUS | Status: DC | PRN
  Administered 2021-09-17 (×3): 120 ug via INTRAVENOUS

## 2021-09-17 MED ORDER — DEXAMETHASONE SODIUM PHOSPHATE 4 MG/ML IJ SOLN
INTRAMUSCULAR | Status: DC | PRN
  Administered 2021-09-17: 10 mg via INTRAVENOUS

## 2021-09-17 MED ORDER — KETOROLAC TROMETHAMINE 30 MG/ML IJ SOLN
INTRAMUSCULAR | Status: AC
  Filled 2021-09-17: qty 1

## 2021-09-17 MED ORDER — 0.9 % SODIUM CHLORIDE (POUR BTL) OPTIME
TOPICAL | Status: DC | PRN
  Administered 2021-09-17: 1000 mL

## 2021-09-17 MED ORDER — DEXAMETHASONE SODIUM PHOSPHATE 10 MG/ML IJ SOLN
INTRAMUSCULAR | Status: AC
  Filled 2021-09-17: qty 1

## 2021-09-17 MED ORDER — ONDANSETRON HCL 4 MG/2ML IJ SOLN
INTRAMUSCULAR | Status: AC
  Filled 2021-09-17: qty 2

## 2021-09-17 MED ORDER — SUGAMMADEX SODIUM 200 MG/2ML IV SOLN
INTRAVENOUS | Status: DC | PRN
  Administered 2021-09-17: 200 mg via INTRAVENOUS

## 2021-09-17 MED ORDER — METOPROLOL TARTRATE 5 MG/5ML IV SOLN
INTRAVENOUS | Status: DC | PRN
  Administered 2021-09-17 (×2): 2 mg via INTRAVENOUS

## 2021-09-17 MED ORDER — PHENYLEPHRINE 40 MCG/ML (10ML) SYRINGE FOR IV PUSH (FOR BLOOD PRESSURE SUPPORT)
PREFILLED_SYRINGE | INTRAVENOUS | Status: AC
  Filled 2021-09-17: qty 10

## 2021-09-17 MED ORDER — FENTANYL CITRATE (PF) 100 MCG/2ML IJ SOLN
INTRAMUSCULAR | Status: DC | PRN
  Administered 2021-09-17: 100 ug via INTRAVENOUS
  Administered 2021-09-17: 50 ug via INTRAVENOUS
  Administered 2021-09-17: 100 ug via INTRAVENOUS
  Administered 2021-09-17: 50 ug via INTRAVENOUS

## 2021-09-17 MED ORDER — ACETAMINOPHEN 500 MG PO TABS
1000.0000 mg | ORAL_TABLET | ORAL | Status: AC
  Administered 2021-09-17: 1000 mg via ORAL
  Filled 2021-09-17: qty 2

## 2021-09-17 MED ORDER — LIDOCAINE 2% (20 MG/ML) 5 ML SYRINGE
INTRAMUSCULAR | Status: DC | PRN
Start: 2021-09-17 — End: 2021-09-17
  Administered 2021-09-17: 60 mg via INTRAVENOUS

## 2021-09-17 MED ORDER — ESMOLOL HCL 100 MG/10ML IV SOLN
INTRAVENOUS | Status: AC
  Filled 2021-09-17: qty 10

## 2021-09-17 MED ORDER — SODIUM CHLORIDE 0.9 % IV SOLN
2.0000 g | INTRAVENOUS | Status: AC
  Administered 2021-09-17: 2 g via INTRAVENOUS
  Filled 2021-09-17: qty 2

## 2021-09-17 MED ORDER — CHLORHEXIDINE GLUCONATE 0.12 % MT SOLN
15.0000 mL | Freq: Once | OROMUCOSAL | Status: AC
  Administered 2021-09-17: 15 mL via OROMUCOSAL

## 2021-09-17 MED ORDER — LACTATED RINGERS IV SOLN
INTRAVENOUS | Status: DC | PRN
  Administered 2021-09-17: 1000 mL

## 2021-09-17 SURGICAL SUPPLY — 51 items
APPLICATOR ARISTA FLEXITIP XL (MISCELLANEOUS) IMPLANT
APPLIER CLIP 5 13 M/L LIGAMAX5 (MISCELLANEOUS)
APPLIER CLIP ROT 10 11.4 M/L (STAPLE)
BAG COUNTER SPONGE SURGICOUNT (BAG) ×2 IMPLANT
BENZOIN TINCTURE PRP APPL 2/3 (GAUZE/BANDAGES/DRESSINGS) IMPLANT
BNDG ADH 1X3 SHEER STRL LF (GAUZE/BANDAGES/DRESSINGS) ×8 IMPLANT
CABLE HIGH FREQUENCY MONO STRZ (ELECTRODE) ×2 IMPLANT
CHLORAPREP W/TINT 26 (MISCELLANEOUS) ×2 IMPLANT
CLIP APPLIE 5 13 M/L LIGAMAX5 (MISCELLANEOUS) IMPLANT
CLIP APPLIE ROT 10 11.4 M/L (STAPLE) IMPLANT
CLIP LIGATING HEMO O LOK GREEN (MISCELLANEOUS) IMPLANT
COVER MAYO STAND STRL (DRAPES) IMPLANT
COVER SURGICAL LIGHT HANDLE (MISCELLANEOUS) ×2 IMPLANT
DECANTER SPIKE VIAL GLASS SM (MISCELLANEOUS) ×2 IMPLANT
DERMABOND ADVANCED (GAUZE/BANDAGES/DRESSINGS) ×1
DERMABOND ADVANCED .7 DNX12 (GAUZE/BANDAGES/DRESSINGS) ×1 IMPLANT
DRAPE C-ARM 42X120 X-RAY (DRAPES) IMPLANT
DRSG TEGADERM 2-3/8X2-3/4 SM (GAUZE/BANDAGES/DRESSINGS) IMPLANT
ELECT REM PT RETURN 15FT ADLT (MISCELLANEOUS) ×2 IMPLANT
GAUZE SPONGE 2X2 8PLY STRL LF (GAUZE/BANDAGES/DRESSINGS) IMPLANT
GLOVE SRG 8 PF TXTR STRL LF DI (GLOVE) ×1 IMPLANT
GLOVE SURG MICRO LTX SZ7.5 (GLOVE) ×2 IMPLANT
GLOVE SURG UNDER POLY LF SZ8 (GLOVE) ×2
GOWN STRL REUS W/TWL XL LVL3 (GOWN DISPOSABLE) ×6 IMPLANT
GRASPER SUT TROCAR 14GX15 (MISCELLANEOUS) IMPLANT
HEMOSTAT ARISTA ABSORB 3G PWDR (HEMOSTASIS) IMPLANT
HEMOSTAT SNOW SURGICEL 2X4 (HEMOSTASIS) IMPLANT
IRRIG SUCT STRYKERFLOW 2 WTIP (MISCELLANEOUS) ×2
IRRIGATION SUCT STRKRFLW 2 WTP (MISCELLANEOUS) ×1 IMPLANT
KIT BASIN OR (CUSTOM PROCEDURE TRAY) ×2 IMPLANT
KIT TURNOVER KIT A (KITS) IMPLANT
L-HOOK LAP DISP 36CM (ELECTROSURGICAL)
LHOOK LAP DISP 36CM (ELECTROSURGICAL) IMPLANT
POUCH RETRIEVAL ECOSAC 10 (ENDOMECHANICALS) ×1 IMPLANT
POUCH RETRIEVAL ECOSAC 10MM (ENDOMECHANICALS) ×2
SCISSORS LAP 5X35 DISP (ENDOMECHANICALS) ×2 IMPLANT
SET CHOLANGIOGRAPH MIX (MISCELLANEOUS) IMPLANT
SET TUBE SMOKE EVAC HIGH FLOW (TUBING) ×2 IMPLANT
SLEEVE XCEL OPT CAN 5 100 (ENDOMECHANICALS) ×4 IMPLANT
SPONGE GAUZE 2X2 STER 10/PKG (GAUZE/BANDAGES/DRESSINGS)
STRIP CLOSURE SKIN 1/2X4 (GAUZE/BANDAGES/DRESSINGS) IMPLANT
SUT MNCRL AB 4-0 PS2 18 (SUTURE) ×2 IMPLANT
SUT VIC AB 0 UR5 27 (SUTURE) ×2 IMPLANT
SUT VICRYL 0 TIES 12 18 (SUTURE) IMPLANT
SUT VICRYL 0 UR6 27IN ABS (SUTURE) IMPLANT
TOWEL OR 17X26 10 PK STRL BLUE (TOWEL DISPOSABLE) ×2 IMPLANT
TOWEL OR NON WOVEN STRL DISP B (DISPOSABLE) ×2 IMPLANT
TRAY LAPAROSCOPIC (CUSTOM PROCEDURE TRAY) ×2 IMPLANT
TROCAR BLADELESS OPT 5 100 (ENDOMECHANICALS) ×2 IMPLANT
TROCAR XCEL BLUNT TIP 100MML (ENDOMECHANICALS) IMPLANT
TROCAR XCEL NON-BLD 11X100MML (ENDOMECHANICALS) IMPLANT

## 2021-09-17 NOTE — Interval H&P Note (Signed)
History and Physical Interval Note:  09/17/2021 12:16 PM  Brent Pham.  has presented today for surgery, with the diagnosis of SYMPTOMATIC CHOLELITHIASIS, BILIARY PANCREATITIS.  The various methods of treatment have been discussed with the patient and family. After consideration of risks, benefits and other options for treatment, the patient has consented to  Procedure(s): LAPAROSCOPIC CHOLECYSTECTOMY WITH INTRAOPERATIVE CHOLANGIOGRAM (N/A) as a surgical intervention.  The patient's history has been reviewed, patient examined, no change in status, stable for surgery.  I have reviewed the patient's chart and labs.  Questions were answered to the patient's satisfaction.     [name]

## 2021-09-17 NOTE — Anesthesia Postprocedure Evaluation (Signed)
Anesthesia Post Note  Patient: Brent Pham.  Procedure(s) Performed: LAPAROSCOPIC CHOLECYSTECTOMY (Abdomen)     Patient location during evaluation: PACU Anesthesia Type: General Level of consciousness: sedated and patient cooperative Pain management: pain level controlled Vital Signs Assessment: post-procedure vital signs reviewed and stable Respiratory status: spontaneous breathing Cardiovascular status: stable Anesthetic complications: no   No notable events documented.  Last Vitals:  Vitals:   09/17/21 1445 09/17/21 1515  BP: (!) 169/74 (!) 147/73  Pulse: 69 65  Resp: 17 16  Temp:    SpO2: 100% 100%    Last Pain:  Vitals:   09/17/21 1515  TempSrc:   PainSc: 9                  Brandonn Capelli

## 2021-09-17 NOTE — Discharge Instructions (Signed)
[facility] LAPAROSCOPIC SURGERY: POST OP INSTRUCTIONS Always review your discharge instruction sheet given to you by the facility where your surgery was performed. IF YOU HAVE DISABILITY OR FAMILY LEAVE FORMS, YOU MUST BRING THEM TO THE OFFICE FOR PROCESSING.   DO NOT GIVE THEM TO YOUR DOCTOR.  PAIN CONTROL  First take acetaminophen (Tylenol) AND/or ibuprofen (Advil) to control your pain after surgery.  Follow directions on package.  Taking acetaminophen (Tylenol) and/or ibuprofen (Advil) regularly after surgery will help to control your pain and lower the amount of prescription pain medication you may need.  You should not take more than 3,000 mg (3 grams) of acetaminophen (Tylenol) in 24 hours.  You should not take ibuprofen (Advil), aleve, motrin, naprosyn or other NSAIDS if you have a history of stomach ulcers or chronic kidney disease.  A prescription for pain medication may be given to you upon discharge.  Take your pain medication as prescribed, if you still have uncontrolled pain after taking acetaminophen (Tylenol) or ibuprofen (Advil). Use ice packs to help control pain. If you need a refill on your pain medication, please contact your pharmacy.  They will contact our office to request authorization. Prescriptions will not be filled after 5pm or on week-ends.  HOME MEDICATIONS Take your usually prescribed medications unless otherwise directed.  DIET You should follow a light diet the first few days after arrival home.  Be sure to include lots of fluids daily. Avoid fatty, fried foods.   CONSTIPATION It is common to experience some constipation after surgery and if you are taking pain medication.  Increasing fluid intake and taking a stool softener (such as Colace) will usually help or prevent this problem from occurring.  A mild laxative (Milk of Magnesia or Miralax) should be taken according to package instructions if there are no bowel movements after 48  hours.  WOUND/INCISION CARE Most patients will experience some swelling and bruising in the area of the incisions.  Ice packs will help.  Swelling and bruising can take several days to resolve.  Unless discharge instructions indicate otherwise, follow guidelines below  STERI-STRIPS - you may remove your outer bandages 48 hours after surgery, and you may shower at that time.  You have steri-strips (small skin tapes) in place directly over the incision.  These strips should be left on the skin for 7-10 days.   DERMABOND/SKIN GLUE - you may shower in 24 hours.  The glue will flake off over the next 2-3 weeks. Any sutures or staples will be removed at the office during your follow-up visit.  ACTIVITIES You may resume regular (light) daily activities beginning the next day--such as daily self-care, walking, climbing stairs--gradually increasing activities as tolerated.  You may have sexual intercourse when it is comfortable.  Refrain from any heavy lifting or straining until approved by your doctor. You may drive when you are no longer taking prescription pain medication, you can comfortably wear a seatbelt, and you can safely maneuver your car and apply brakes.  FOLLOW-UP You should see your doctor in the office for a follow-up appointment approximately 2-3 weeks after your surgery.  You should have been given your post-op/follow-up appointment when your surgery was scheduled.  If you did not receive a post-op/follow-up appointment, make sure that you call for this appointment within a day or two after you arrive home to insure a convenient appointment time.  OTHER INSTRUCTIONS   WHEN TO CALL YOUR DOCTOR: Fever over 101.0 Inability to urinate Continued   bleeding from incision. Increased pain, redness, or drainage from the incision. Increasing abdominal pain  The clinic staff is available to answer your questions during regular business hours.  Please don't hesitate to call and ask to speak to  one of the nurses for clinical concerns.  If you have a medical emergency, go to the nearest emergency room or call 911.  A surgeon from [facility] is always on call at the hospital. [address], [address], [place], [place]  [address] ? [address] [address], [place],    [address] [phone] ? [phone] ? FAX [phone] Web site: [email or web address]  

## 2021-09-17 NOTE — Op Note (Signed)
Brent Pham [id number] 1951/09/03 09/17/2021  Laparoscopic Cholecystectomy Procedure Note  Indications: This patient presents with symptomatic gallbladder disease and will undergo laparoscopic cholecystectomy.  Patient had been admitted to outside hospital with gallstone pancreatitis.  Pre-operative Diagnosis: Biliary pancreatitis  Post-operative Diagnosis: same  Surgeon: [name] MD FACS  Assistants: none  Anesthesia: General endotracheal anesthesia  Procedure Details  The patient was seen again in the Holding Room. The risks, benefits, complications, treatment options, and expected outcomes were discussed with the patient. The possibilities of reaction to medication, pulmonary aspiration, perforation of viscus, bleeding, recurrent infection, finding a normal gallbladder, the need for additional procedures, failure to diagnose a condition, the possible need to convert to an open procedure, and creating a complication requiring transfusion or operation were discussed with the patient. The likelihood of improving the patient's symptoms with return to their baseline status is good.  The patient and/or family concurred with the proposed plan, giving informed consent. The site of surgery properly noted. The patient was taken to Operating Room, identified as Brent Pham. and the procedure verified as Laparoscopic Cholecystectomy with Intraoperative Cholangiogram. A Time Out was held and the above information confirmed. Antibiotic prophylaxis was administered.   Prior to the induction of general anesthesia, antibiotic prophylaxis was administered. General endotracheal anesthesia was then administered and tolerated well. After the induction, the abdomen was prepped with Chloraprep and draped in the sterile fashion. The patient was positioned in the supine position.  Local anesthetic agent was injected into the skin near the umbilicus and an incision made. We dissected down to the  abdominal fascia with blunt dissection.  The fascia was incised vertically and we entered the peritoneal cavity bluntly.  A pursestring suture of 0-Vicryl was placed around the fascial opening.  The Hasson cannula was inserted and secured with the stay suture.  Pneumoperitoneum was then created with CO2 and tolerated well without any adverse changes in the patient's vital signs. An 5-mm port was placed in the subxiphoid position.  Two 5-mm ports were placed in the right upper quadrant. All skin incisions were infiltrated with a local anesthetic agent before making the incision and placing the trocars.   We positioned the patient in reverse Trendelenburg, tilted slightly to the patient's left.  The gallbladder was identified, the fundus grasped and retracted cephalad.  Gallbladder was very distended so I aspirated in order to facilitate retraction.  Adhesions were lysed bluntly and with the electrocautery where indicated, taking care not to injure any adjacent organs or viscus. The infundibulum was grasped and retracted laterally, exposing the peritoneum overlying the triangle of Calot. This was then divided and exposed in a blunt fashion. A critical view of the cystic duct and cystic artery was obtained.  The cystic duct was clearly identified and bluntly dissected circumferentially. The cystic duct was ligated with a clip distally.   An incision was made in the cystic duct; however despite multiple attempts I could not open up the duct sufficiently to accommodate a cholangiogram catheter.  There was bile coming out of the ductotomy but I simply could not open it up sufficiently in order to accommodate a cholangiogram catheter so I decided not to perform a cholangiogram..   The cystic duct was then ligated with clips and divided. The cystic artery which had been identified & dissected free was ligated with clips and divided as well.   The gallbladder was dissected from the liver bed in retrograde fashion with  the electrocautery. The gallbladder  was removed and placed in an Ecco sac.  The gallbladder and Ecco sac were then removed through the umbilical port site. The liver bed was irrigated and inspected. Hemostasis was achieved with the electrocautery. Copious irrigation was utilized and was repeatedly aspirated until clear.  The pursestring suture was used to close the umbilical fascia.  I placed 2 additional interrupted 0 Vicryl's at the umbilical fascia using a PMI suture passer with laparoscopic guidance.  We again inspected the right upper quadrant for hemostasis.  The umbilical closure was inspected and there was no air leak and nothing trapped within the closure. Pneumoperitoneum was released as we removed the trocars.  4-0 Monocryl was used to close the skin.   Dermabond was applied. The patient was then extubated and brought to the recovery room in stable condition. Instrument, sponge, and needle counts were correct at closure and at the conclusion of the case.   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         Disposition: PACU - hemodynamically stable.         Condition: stable  Mary Sella. Andrey Campanile, MD, FACS General, Bariatric, & Minimally Invasive Surgery [facility], [place]

## 2021-09-17 NOTE — Anesthesia Procedure Notes (Signed)
Procedure Name: Intubation Date/Time: 09/17/2021 12:29 PM Performed by: Vanessa [place], CRNA Pre-anesthesia Checklist: Patient identified, Emergency Drugs available, Suction available and Patient being monitored Patient Re-evaluated:Patient Re-evaluated prior to induction Oxygen Delivery Method: Circle system utilized Preoxygenation: Pre-oxygenation with 100% oxygen Induction Type: IV induction Ventilation: Mask ventilation without difficulty Laryngoscope Size: 2 and Miller Grade View: Grade I Tube type: Oral Tube size: 7.5 mm Number of attempts: 1 Airway Equipment and Method: Stylet Placement Confirmation: ETT inserted through vocal cords under direct vision, positive ETCO2 and breath sounds checked- equal and bilateral Secured at: 21 cm Tube secured with: Tape Dental Injury: Teeth and Oropharynx as per pre-operative assessment

## 2021-09-17 NOTE — Transfer of Care (Signed)
Immediate Anesthesia Transfer of Care Note  Patient: Brent Pham.  Procedure(s) Performed: LAPAROSCOPIC CHOLECYSTECTOMY (Abdomen)  Patient Location: PACU  Anesthesia Type:General  Level of Consciousness: awake, alert , oriented and patient cooperative  Airway & Oxygen Therapy: Patient Spontanous Breathing and Patient connected to face mask  Post-op Assessment: Report given to RN and Post -op Vital signs reviewed and stable  Post vital signs: Reviewed and stable  Last Vitals:  Vitals Value Taken Time  BP 183/94 09/17/21 1349  Temp    Pulse 73 09/17/21 1350  Resp 20 09/17/21 1350  SpO2 100 % 09/17/21 1350  Vitals shown include unvalidated device data.  Last Pain:  Vitals:   09/17/21 1001  TempSrc:   PainSc: 0-No pain         Complications: No notable events documented.

## 2021-09-17 NOTE — H&P (Signed)
DATE OF ENCOUNTER: 08/27/2021  Subjective   Chief Complaint: Abdominal Pain   History of Present Illness: Brent Pham is a 70 y.o. male who is seen today as an office consultation at the request of Dr. Pasty Arch for evaluation of Abdominal Pain .   Was seen in the ER at [facility] on October 11 with complaints of right upper quadrant, epigastric, left upper quadrant intermittent nonradiating dull pain with occasional episodes of shortness of breath. He had associated nausea and vomiting and chills. Pain was worse with eating. Better with fasting. Tylenol had helped with some relief. Apparently he had been seen back in September with similar complaints and was told he needed gallbladder surgery.  He was admitted [place] from September 10 through 13 with acute biliary pancreatitis. Presenting lipase was elevated at 593. He had a CT that showed distended gallbladder containing sludge. Abdominal ultrasound showed cholelithiasis without evidence of acute cholecystitis. He underwent MRCP which demonstrated mild gallbladder distention without wall thickening, biliary dilatation or choledocholithiasis. It also noted some mild soft tissue stranding around the pancreas. He also had mild acute kidney injury with an admission creatinine of 1.3 which normalized to below 1 on day of discharge. His LFTs were normal. He has some mild chronic anemia on CBCs that I reviewed.  Comorbidities include hypertension, hyperlipidemia, diabetes mellitus type 2  He is not on Plavix  He denies TIAs or amaurosis fugax  He is still taking some Tylenol for abdominal discomfort but his pain is significantly better than it was on the 11th. no Fever chills Review of Systems: A complete review of systems was obtained from the patient. I have reviewed this information and discussed as appropriate with the patient. See HPI as well for other ROS.  ROS   Medical History: Past Medical History:  Diagnosis Date    Diabetes mellitus without complication (CMS-HCC)   History of stroke 01/2020   Hyperlipidemia   Hypertension   There is no problem list on file for this patient.  No past surgical history on file.  Prior inguinal hernia repair No Known Allergies  Current Outpatient Medications on File Prior to Visit  Medication Sig Dispense Refill   blood glucose meter kit Check blood sugar 2 x daily Diag. E11.9   lancets 33 gauge Misc Check BS BID Diag. E11.9   lisinopriL (ZESTRIL) 5 MG tablet Take 5 mg by mouth once daily   metoprolol succinate (TOPROL-XL) 50 MG XL tablet Take by mouth   ondansetron (ZOFRAN-ODT) 4 MG disintegrating tablet Take 4 mg by mouth every 8 (eight) hours as needed   sitaGLIPtin-metFORMIN (JANUMET) 50-1,000 mg tablet Take by mouth   aspirin 81 MG EC tablet Take 81 mg by mouth once daily   ONETOUCH ULTRA TEST test strip 1 strip 2 (two) times daily   No current facility-administered medications on file prior to visit.   No family history on file.   Social History   Tobacco Use  Smoking Status Never Smoker  Smokeless Tobacco Never Used    Social History   Socioeconomic History   Marital status: Single  Tobacco Use   Smoking status: Never Smoker   Smokeless tobacco: Never Used  Scientific laboratory technician Use: Never used  Substance and Sexual Activity   Alcohol use: Never   Drug use: Never   Objective:   Vitals:  08/27/21 1541  BP: 118/58  Pulse: 90  Temp: 36.7 C (98.1 F)  SpO2: 99%  Weight: 73.2  kg (161 lb 6.4 oz)  Height: 175.3 cm (_0 )   Body mass index is 23.83 kg/m.  Constitutional: NAD; conversant; no deformities Eyes: Moist conjunctiva; no lid lag; anicteric; PERRL Neck: Trachea midline; no thyromegaly Lungs: Normal respiratory effort; no tactile fremitus CV: RRR; no palpable thrills; no pitting edema GI: Abd soft, nontender, nondistended; no palpable hepatosplenomegaly MSK: Normal gait; no clubbing/cyanosis Psychiatric: Appropriate affect;  alert and oriented x3 Lymphatic: No palpable cervical or axillary lymphadenopathy Skin: No rash or jaundice  Labs, Imaging and Diagnostic Testing: Documented above  Assessment and Plan:  Diagnoses and all orders for this visit:  Calculus of gallbladder with chronic cholecystitis without obstruction  Gallstone pancreatitis  Hypertension, essential  History of CVA (cerebrovascular accident)  Type 2 diabetes mellitus with diabetic polyneuropathy, without long-term current use of insulin (CMS-HCC)    I believe the patient's symptoms are consistent with gallbladder disease.  We discussed gallbladder disease. The patient was given Neurosurgeon. We discussed non-operative and operative management. We discussed the signs & symptoms of acute cholecystitis  I discussed laparoscopic cholecystectomy with possible IOC in detail. The patient was given educational material as well as diagrams detailing the procedure. We discussed the risks and benefits of a laparoscopic cholecystectomy including, but not limited to bleeding, infection, injury to surrounding structures such as the intestine or liver, bile leak, retained gallstones, need to convert to an open procedure, prolonged diarrhea, blood clots such as DVT, common bile duct injury, anesthesia risks, and possible need for additional procedures. We discussed the typical post-operative recovery course. I explained that the likelihood of improvement of their symptoms is good.  Our office will be contacting him to schedule surgery. He wants to proceed with surgery This patient encounter took 35 minutes today to perform the following: take history, perform exam, review outside records, interpret imaging, counsel the patient on their diagnosis and document encounter, findings & plan in the EHR  No follow-ups on file.  Keyon Liller Leanne Chang, MD  General, Minimally Invasive, & Bariatric Surgery

## 2021-09-17 NOTE — Anesthesia Preprocedure Evaluation (Addendum)
Anesthesia Evaluation  Patient identified by MRN, date of birth, ID band Patient awake    Reviewed: Allergy & Precautions, NPO status , Patient's Chart, lab work & pertinent test results  Airway Mallampati: II  TM Distance: >3 FB Neck ROM: Full    Dental  (+) Edentulous Upper, Edentulous Lower, Dental Advisory Given   Pulmonary neg pulmonary ROS,    Pulmonary exam normal breath sounds clear to auscultation       Cardiovascular hypertension, Pt. on medications Normal cardiovascular exam Rhythm:Regular Rate:Normal     Neuro/Psych  Neuromuscular disease CVA    GI/Hepatic negative GI ROS, Neg liver ROS,   Endo/Other  diabetes  Renal/GU negative Renal ROS     Musculoskeletal  (+) Arthritis ,   Abdominal   Peds  Hematology negative hematology ROS (+)   Anesthesia Other Findings   Reproductive/Obstetrics                            Anesthesia Physical Anesthesia Plan  ASA: 3  Anesthesia Plan: General   Post-op Pain Management:    Induction: Intravenous  PONV Risk Score and Plan: 3 and Treatment may vary due to age or medical condition, Ondansetron, Dexamethasone and Midazolam  Airway Management Planned: Oral ETT  Additional Equipment: None  Intra-op Plan:   Post-operative Plan: Extubation in OR  Informed Consent: I have reviewed the patients History and Physical, chart, labs and discussed the procedure including the risks, benefits and alternatives for the proposed anesthesia with the patient or authorized representative who has indicated his/her understanding and acceptance.     Dental advisory given  Plan Discussed with: CRNA  Anesthesia Plan Comments:       Anesthesia Quick Evaluation

## 2021-09-18 ENCOUNTER — Encounter (HOSPITAL_COMMUNITY): Payer: Self-pay | Admitting: General Surgery

## 2021-09-21 LAB — SURGICAL PATHOLOGY

## 2022-11-18 IMAGING — US US ABDOMEN LIMITED
1 series · 14 of 25 positions shown · non-contrast
Comparison: CT and ultrasound 07/25/2021

CLINICAL DATA: Right upper quadrant pain

EXAM:
ULTRASOUND ABDOMEN LIMITED RIGHT UPPER QUADRANT

[Series 1: us abdomen limited ruq (liver/gb) · 14 of 59 slices shown]
[im 1/59]
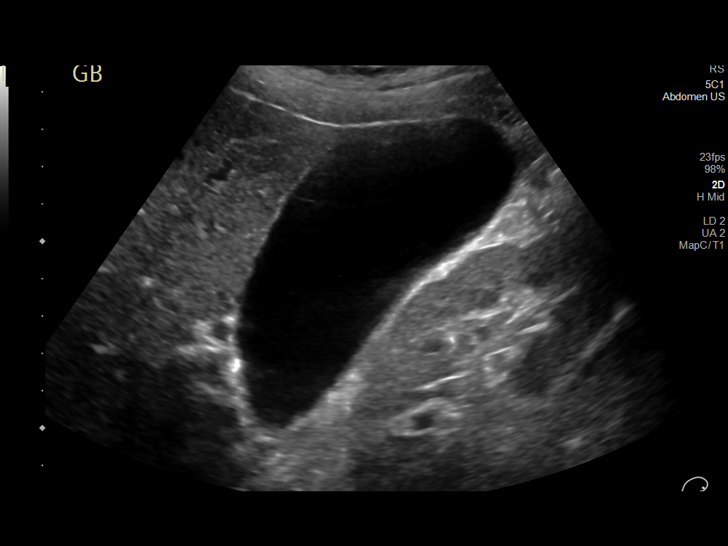
[im 5/59]
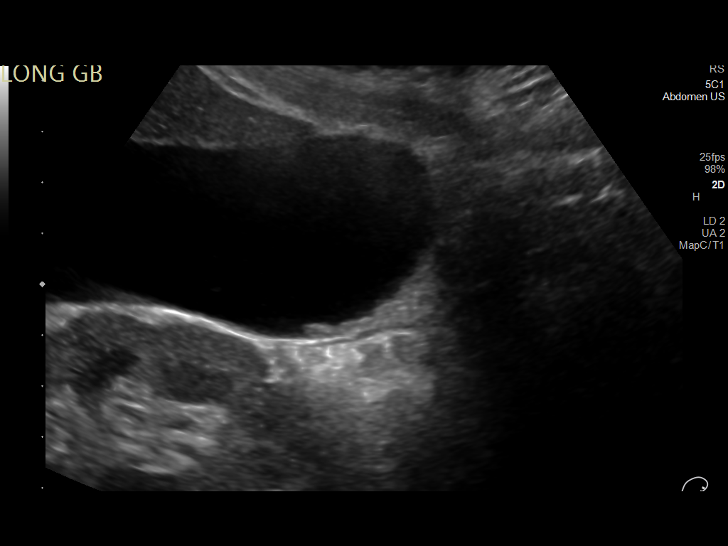
[im 10/59]
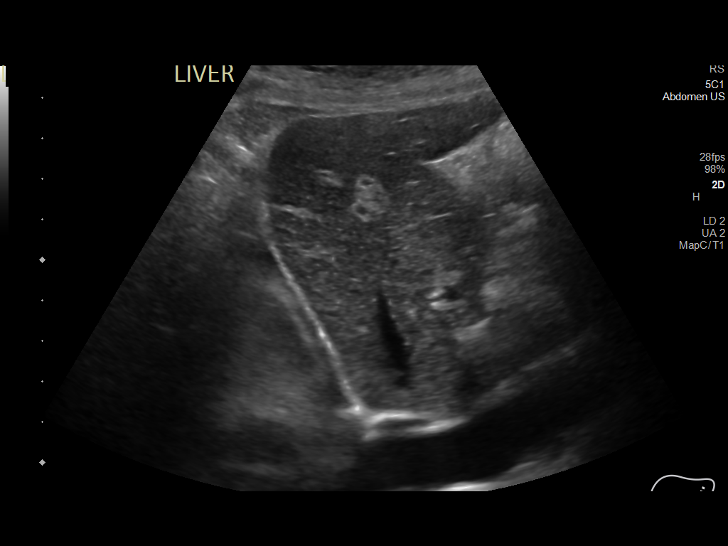
[im 15/59]
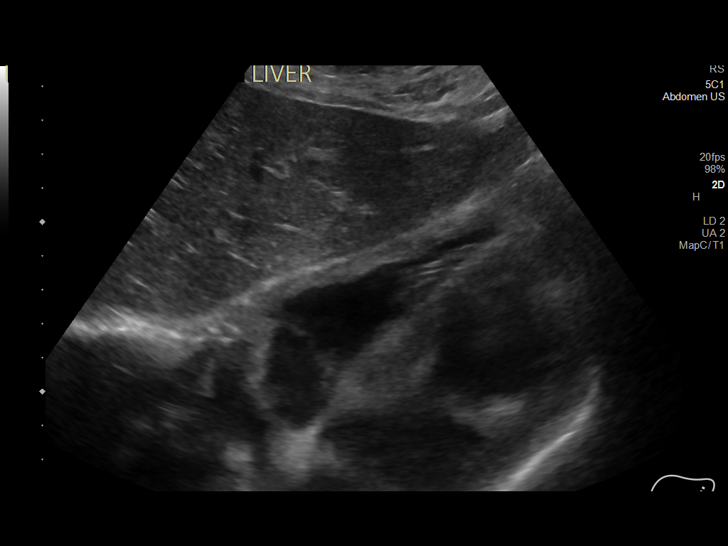
[im 20/59]
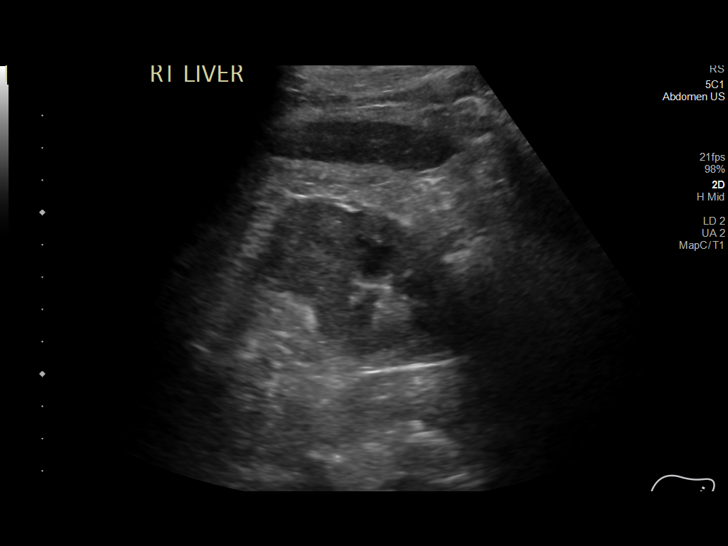
[im 22/59]
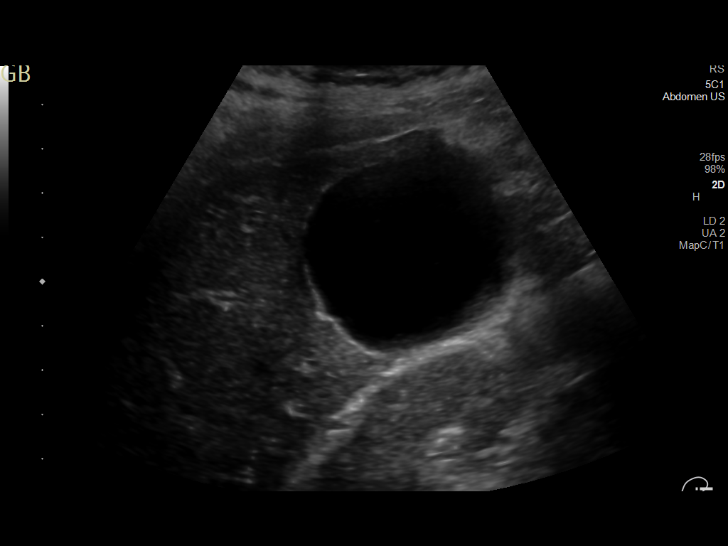
[im 27/59]
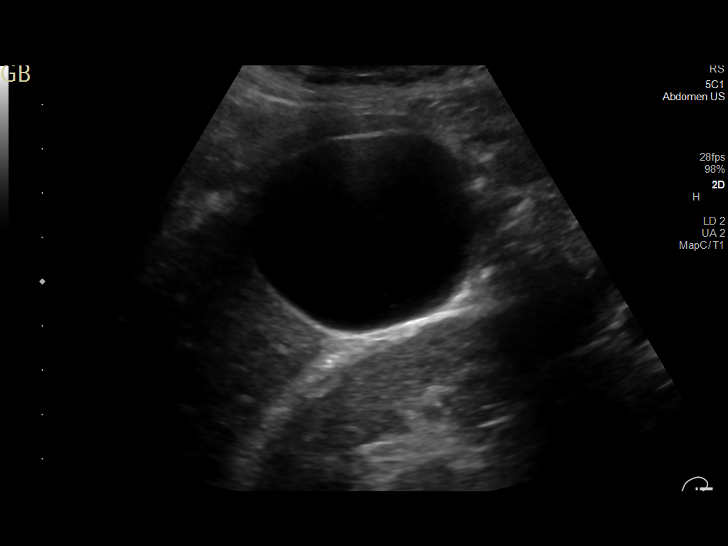
[im 32/59]
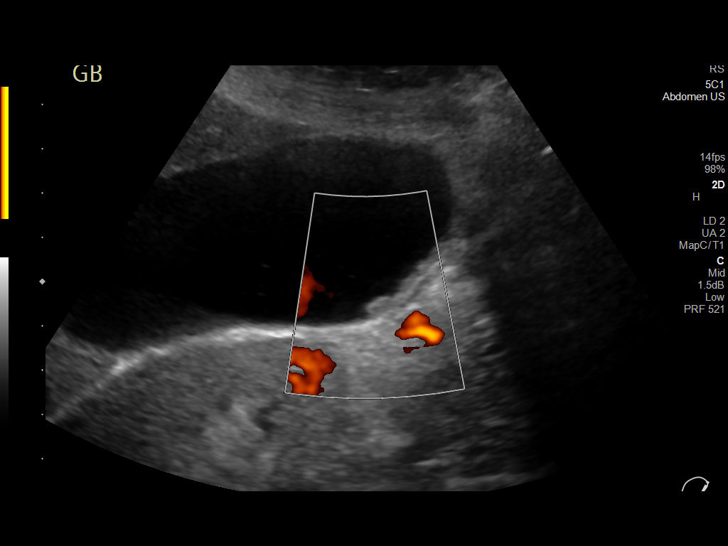
[im 37/59]
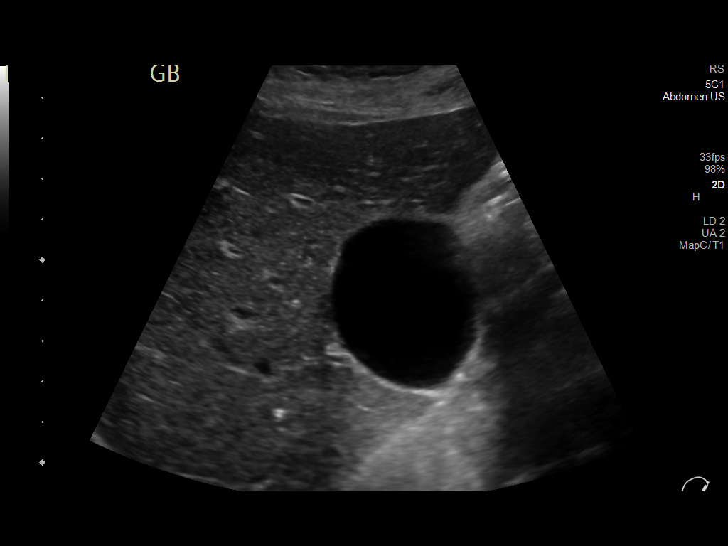
[im 39/59]
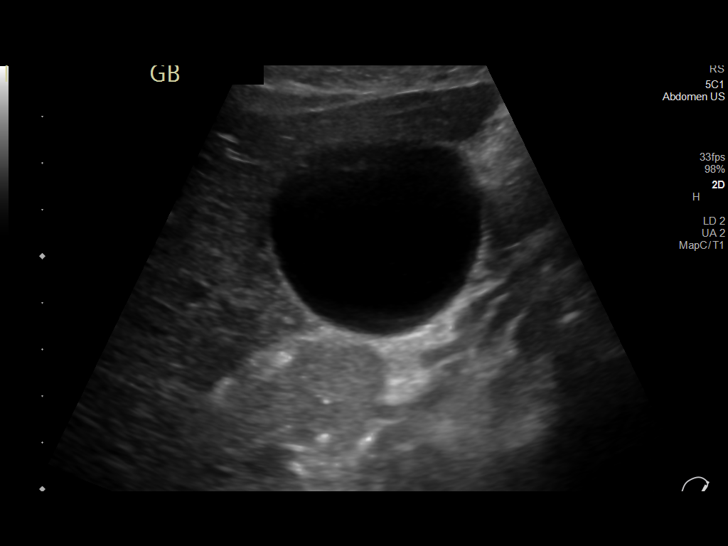
[im 44/59]
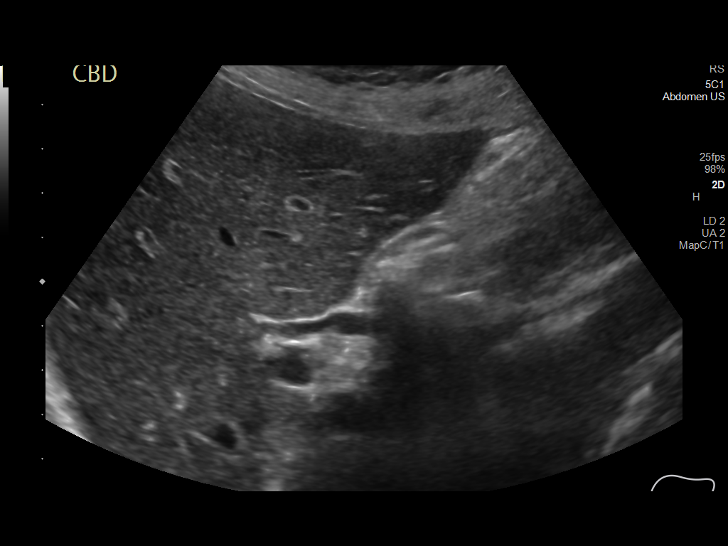
[im 49/59]
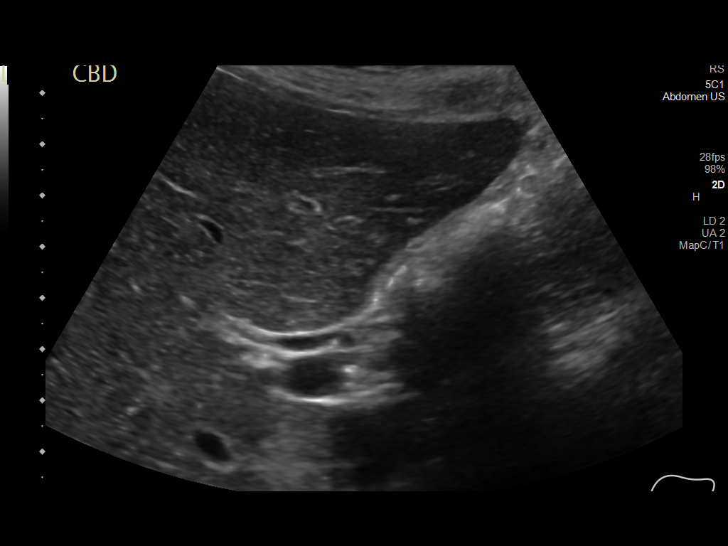
[im 54/59]
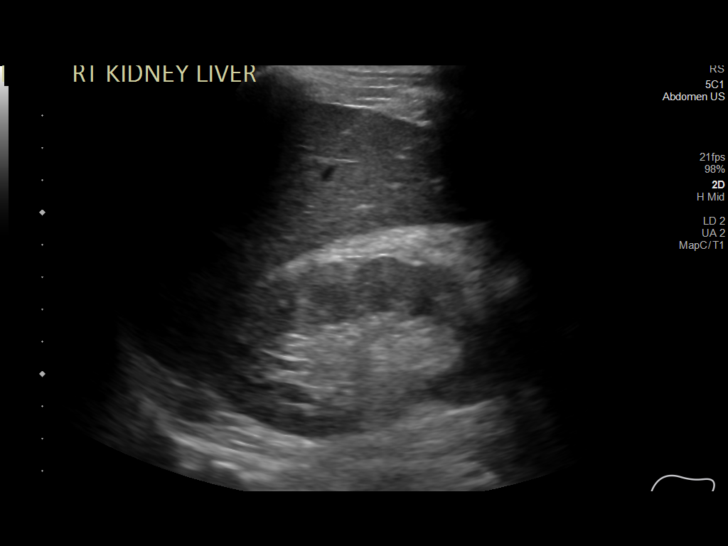
[im 59/59]
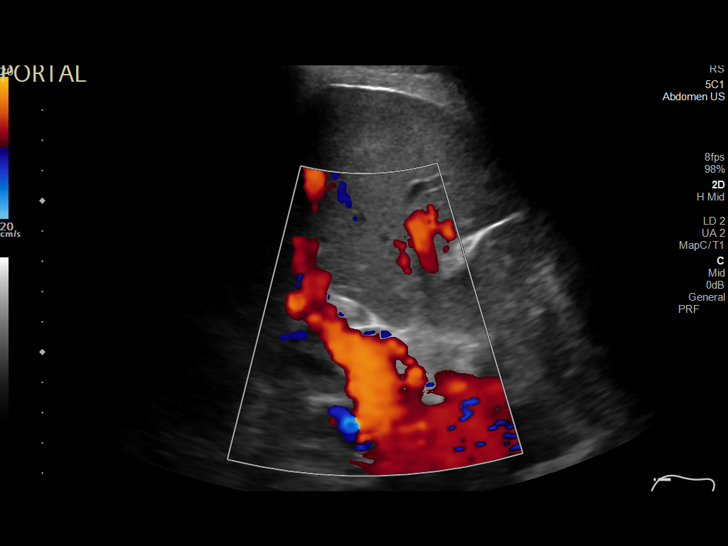

[14 of 25 positions shown; findings below may reference images not displayed]

FINDINGS: Gallbladder:

5 mm nonshadowing focus along the gallbladder wall compatible with
small polyp or nonshadowing stone. No visible stones or wall
thickening. Small amount of layering sludge.

Common bile duct:

Diameter: Normal caliber, 2 mm

Liver:

No focal lesion identified. Within normal limits in parenchymal
echogenicity. Portal vein is patent on color Doppler imaging with
normal direction of blood flow towards the liver.

Other: None.
IMPRESSION: No acute findings.

Small gallbladder wall polyp or nonshadowing stone. No evidence of
acute cholecystitis.

## 2022-11-18 IMAGING — CT CT ABD-PELV W/ CM
2 of 5 series · 16 of 46 positions shown, 18 images · IV contrast (APPLIED)
Comparison: July 25, 2021

CLINICAL DATA: Abdominal pain and vomiting.

EXAM:
CT ABDOMEN AND PELVIS WITH CONTRAST
TECHNIQUE: Multidetector CT imaging of the abdomen and pelvis was performed
using the standard protocol following bolus administration of
intravenous contrast.
CONTRAST:  80mL OMNIPAQUE IOHEXOL 350 MG/ML SOLN

[Series 3: abd/ pelvis 5.0 i30f 2 · axial · 0.79mm/px · z∈[-141,+259]mm · 13 of 90 slices shown, 15 images]
[im 5/90  soft-tissue]
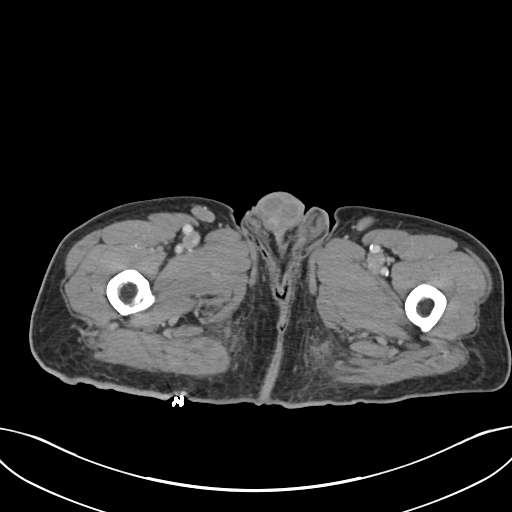
[im 5/90  bone]
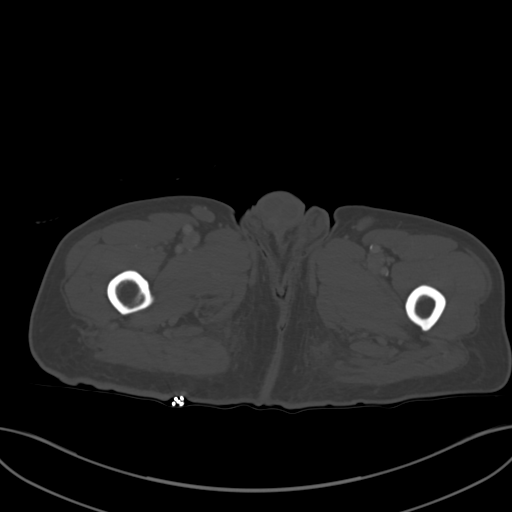
[im 15/90  soft-tissue]
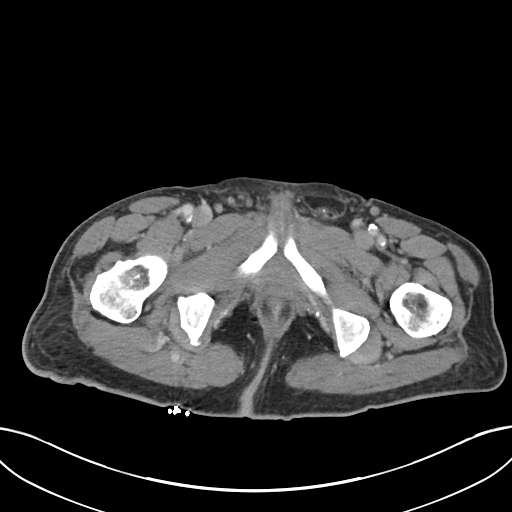
[im 19/90  soft-tissue]
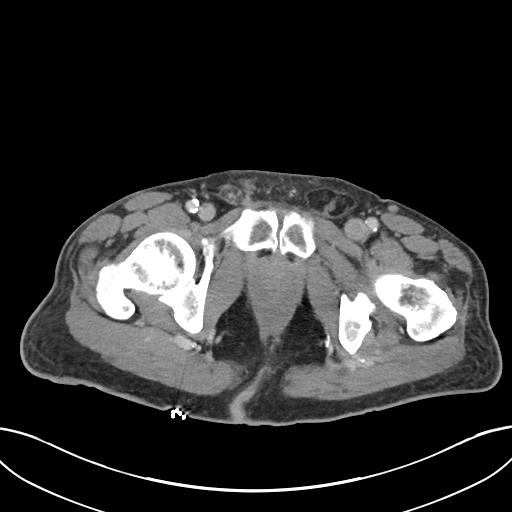
[im 24/90  soft-tissue]
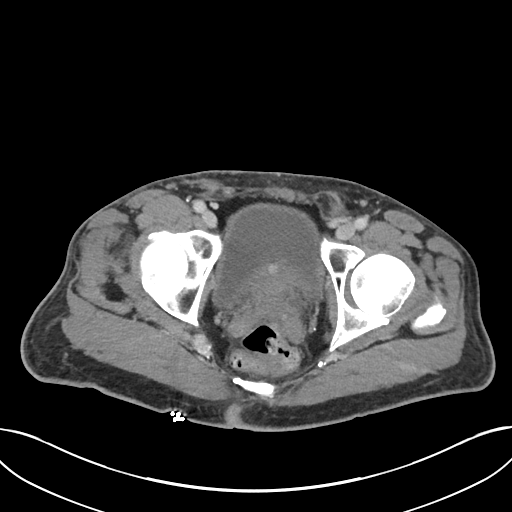
[im 33/90  soft-tissue]
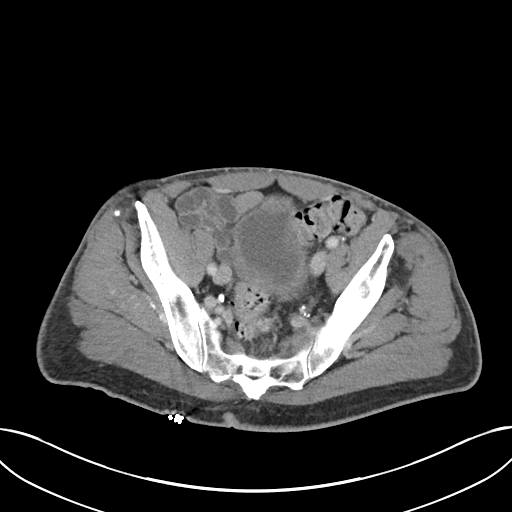
[im 38/90  soft-tissue]
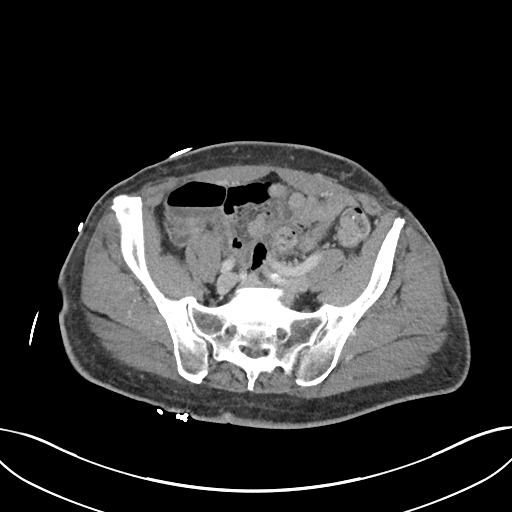
[im 47/90  soft-tissue]
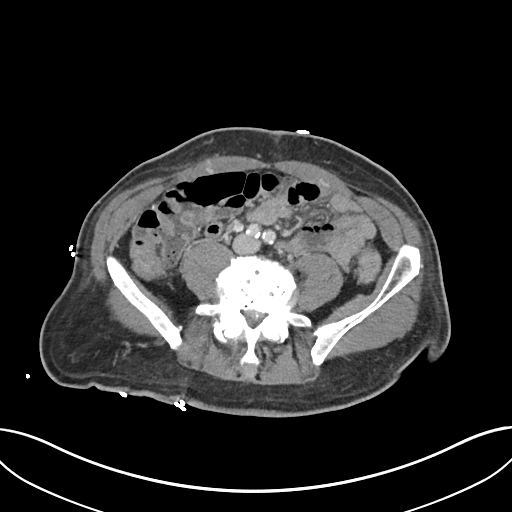
[im 52/90  soft-tissue]
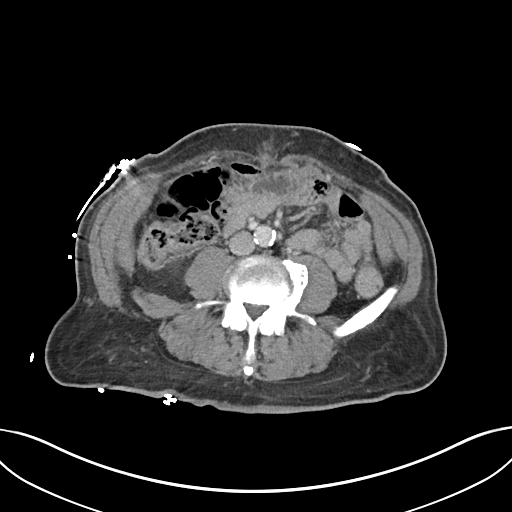
[im 57/90  soft-tissue]
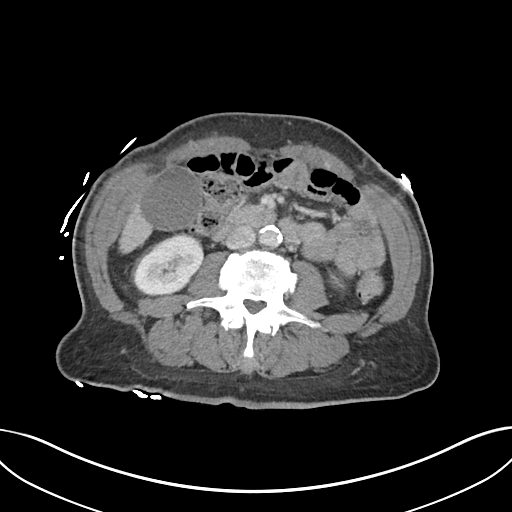
[im 57/90  bone]
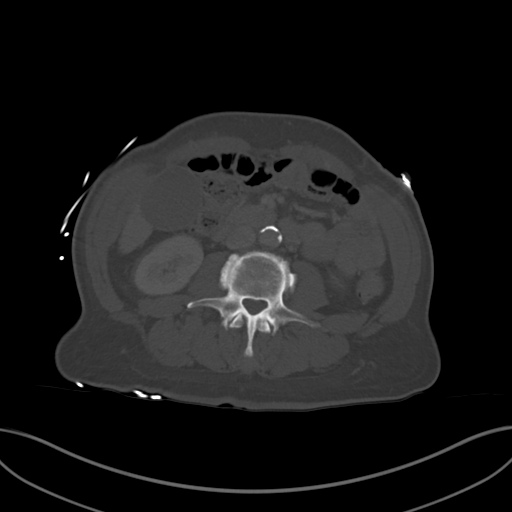
[im 66/90  soft-tissue]
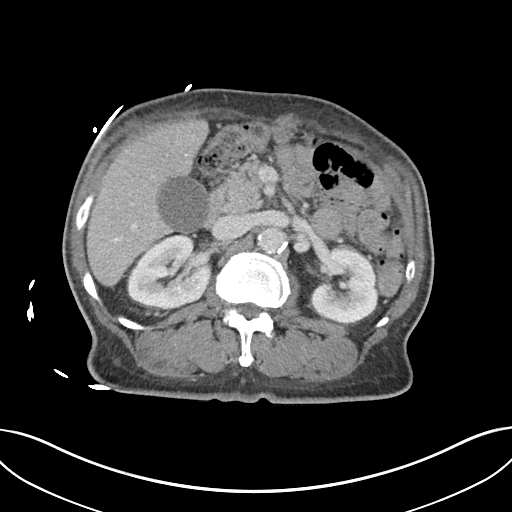
[im 71/90  soft-tissue]
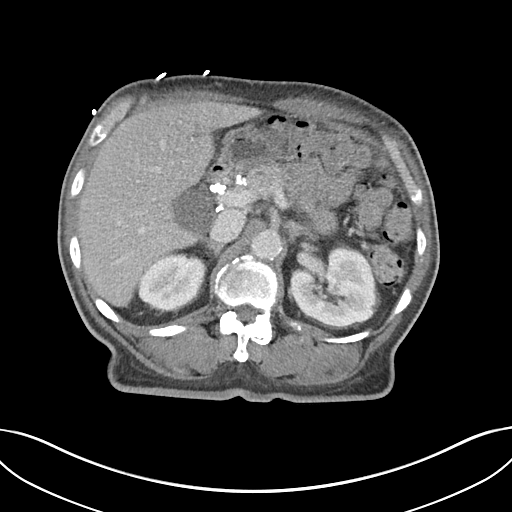
[im 75/90  soft-tissue]
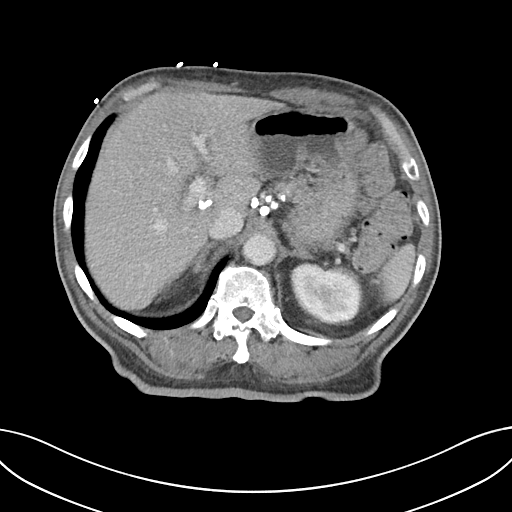
[im 85/90  soft-tissue]
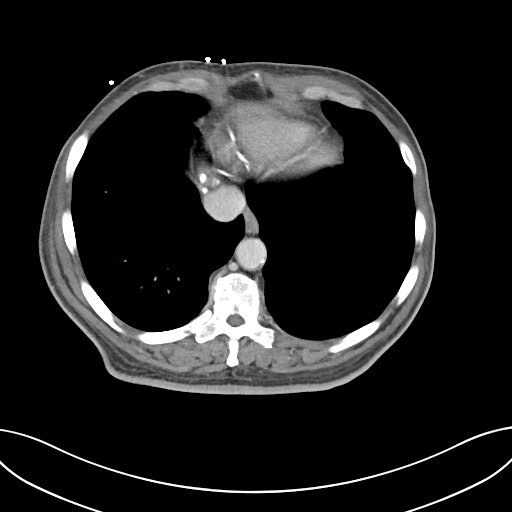

[Series 6: coronal soft tissue · coronal · 0.79mm/px · 3 of 101 slices shown]
[im 34/101  soft-tissue]
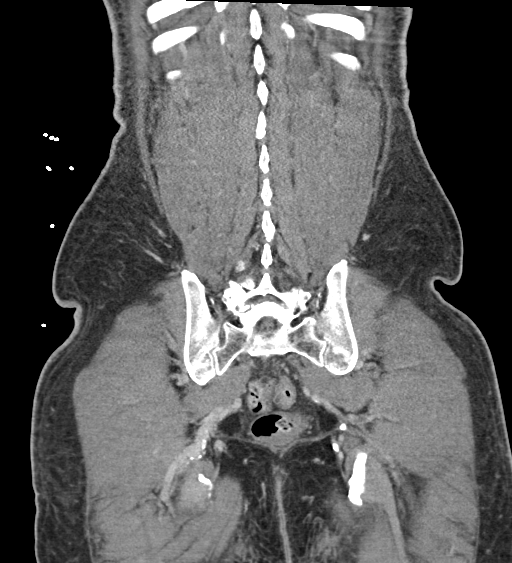
[im 45/101  soft-tissue]
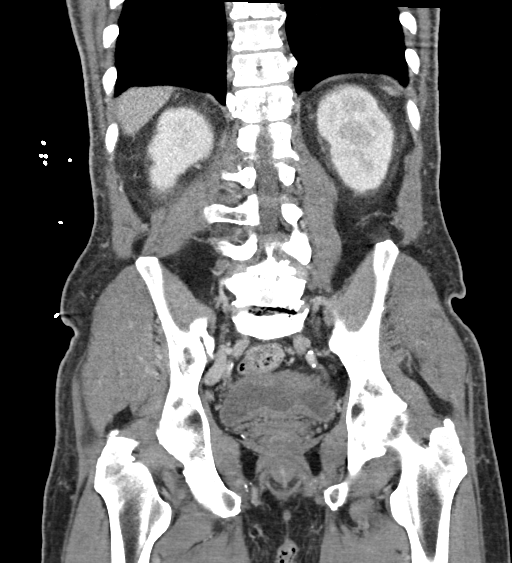
[im 56/101  soft-tissue]
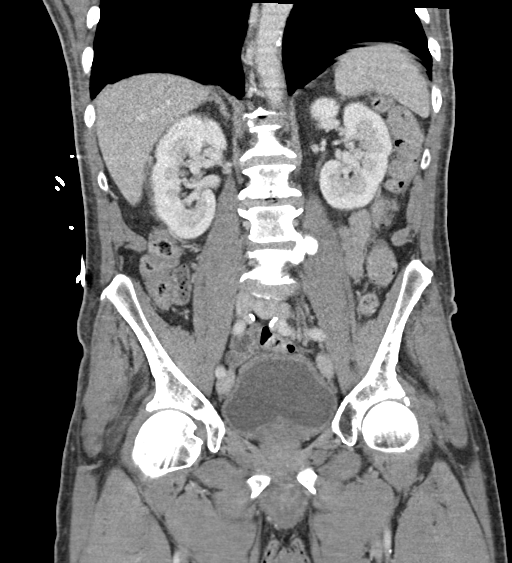

[16 of 46 positions shown; findings below may reference images not displayed]

FINDINGS: Lower chest: No acute abnormality.

Hepatobiliary: There is diffuse fatty infiltration of the liver
parenchyma. No focal liver abnormality is seen. Numerous stable
subcentimeter calcifications are seen along the porta hepatis,
adjacent to the distal esophagus and anterior to the
gastroesophageal junction. The gallbladder is markedly distended. No
gallstones, gallbladder wall thickening, or biliary dilatation.

Pancreas: Unremarkable. No pancreatic ductal dilatation or
surrounding inflammatory changes.

Spleen: Normal in size without focal abnormality.

Adrenals/Urinary Tract: Adrenal glands are unremarkable. Kidneys are
normal, without renal calculi, focal lesion, or hydronephrosis.
Mild, stable posterior urinary bladder wall thickening is seen. This
is seen as far back as May 16, 2017. The anterior urinary bladder
wall thickening seen on the prior study is no longer visualized.

Stomach/Bowel: Stomach is within normal limits. The appendix is not
clearly identified. No evidence of bowel dilatation. Marked severity
asymmetric colonic wall thickening is seen within the region of the
mid ascending colon (axial CT images 42 through 49, CT series
3/coronal reformatted images 39 through 51, CT series 6). This is
not clearly identified on the prior study.

Vascular/Lymphatic: Aortic atherosclerosis. No enlarged abdominal or
pelvic lymph nodes.

Reproductive: Prostate is unremarkable.

Other: A stable 3.8 cm x 2.0 cm fat containing left inguinal hernia
is seen. No abdominopelvic ascites.

Musculoskeletal: Marked severity degenerative changes are seen at
the level of L4-L5.
IMPRESSION: 1. Colonic wall thickening within the region of the mid ascending
colon, which is not seen on the prior study and may represent
sequelae associated with moderate to marked severity colitis.
2. Fatty liver.
3. Markedly distended gallbladder without evidence of cholelithiasis
or acute cholecystitis.

Aortic Atherosclerosis ([id number]).
